# Patient Record
Sex: Female | Born: 1975 | Race: White | Hispanic: No | Marital: Married | Smoking: Former smoker
Health system: Southern US, Community
[De-identification: ages and names within clinical notes are randomized; demographics above are authoritative.]

## PROBLEM LIST (undated history)

## (undated) DIAGNOSIS — Z720 Tobacco use: Secondary | ICD-10-CM

## (undated) DIAGNOSIS — F4321 Adjustment disorder with depressed mood: Secondary | ICD-10-CM

## (undated) DIAGNOSIS — L509 Urticaria, unspecified: Secondary | ICD-10-CM

## (undated) DIAGNOSIS — G4700 Insomnia, unspecified: Secondary | ICD-10-CM

## (undated) DIAGNOSIS — E559 Vitamin D deficiency, unspecified: Secondary | ICD-10-CM

## (undated) HISTORY — PX: DENTAL SURGERY: SHX609

## (undated) HISTORY — DX: Vitamin D deficiency, unspecified: E55.9

## (undated) HISTORY — DX: Insomnia, unspecified: G47.00

## (undated) HISTORY — PX: KNEE ARTHROSCOPY: SHX127

## (undated) HISTORY — DX: Adjustment disorder with depressed mood: F43.21

## (undated) HISTORY — DX: Tobacco use: Z72.0

## (undated) HISTORY — DX: Urticaria, unspecified: L50.9

---

## 2000-10-02 ENCOUNTER — Encounter: Payer: Self-pay | Admitting: Obstetrics and Gynecology

## 2000-10-02 ENCOUNTER — Ambulatory Visit (HOSPITAL_COMMUNITY): Admission: RE | Admit: 2000-10-02 | Discharge: 2000-10-02 | Payer: Self-pay | Admitting: Obstetrics and Gynecology

## 2001-02-13 ENCOUNTER — Inpatient Hospital Stay (HOSPITAL_COMMUNITY): Admission: AD | Admit: 2001-02-13 | Discharge: 2001-02-15 | Payer: Self-pay | Admitting: Obstetrics and Gynecology

## 2001-02-16 ENCOUNTER — Encounter: Admission: RE | Admit: 2001-02-16 | Discharge: 2001-03-18 | Payer: Self-pay | Admitting: Obstetrics and Gynecology

## 2001-09-17 ENCOUNTER — Other Ambulatory Visit: Admission: RE | Admit: 2001-09-17 | Discharge: 2001-09-17 | Payer: Self-pay | Admitting: Obstetrics and Gynecology

## 2002-09-23 ENCOUNTER — Other Ambulatory Visit: Admission: RE | Admit: 2002-09-23 | Discharge: 2002-09-23 | Payer: Self-pay | Admitting: Obstetrics and Gynecology

## 2002-09-26 ENCOUNTER — Emergency Department (HOSPITAL_COMMUNITY): Admission: EM | Admit: 2002-09-26 | Discharge: 2002-09-26 | Payer: Self-pay | Admitting: Emergency Medicine

## 2002-09-26 ENCOUNTER — Encounter: Payer: Self-pay | Admitting: Emergency Medicine

## 2002-10-28 ENCOUNTER — Ambulatory Visit (HOSPITAL_COMMUNITY): Admission: RE | Admit: 2002-10-28 | Discharge: 2002-10-28 | Payer: Self-pay | Admitting: Obstetrics and Gynecology

## 2002-10-28 ENCOUNTER — Encounter: Payer: Self-pay | Admitting: Obstetrics and Gynecology

## 2004-04-12 ENCOUNTER — Other Ambulatory Visit: Admission: RE | Admit: 2004-04-12 | Discharge: 2004-04-12 | Payer: Self-pay | Admitting: Obstetrics and Gynecology

## 2005-06-13 ENCOUNTER — Other Ambulatory Visit: Admission: RE | Admit: 2005-06-13 | Discharge: 2005-06-13 | Payer: Self-pay | Admitting: Obstetrics and Gynecology

## 2012-06-21 ENCOUNTER — Other Ambulatory Visit: Payer: Self-pay | Admitting: Internal Medicine

## 2012-06-21 ENCOUNTER — Ambulatory Visit
Admission: RE | Admit: 2012-06-21 | Discharge: 2012-06-21 | Disposition: A | Discharge status: Home / Self Care | Payer: BC Managed Care – PPO | Source: Ambulatory Visit | Attending: Internal Medicine | Admitting: Internal Medicine

## 2012-06-21 DIAGNOSIS — M791 Myalgia, unspecified site: Secondary | ICD-10-CM

## 2015-01-23 ENCOUNTER — Other Ambulatory Visit: Payer: Self-pay | Admitting: Internal Medicine

## 2015-01-23 DIAGNOSIS — R109 Unspecified abdominal pain: Secondary | ICD-10-CM

## 2015-01-26 ENCOUNTER — Ambulatory Visit
Admission: RE | Admit: 2015-01-26 | Discharge: 2015-01-26 | Disposition: A | Discharge status: Home / Self Care | Payer: BLUE CROSS/BLUE SHIELD | Source: Ambulatory Visit | Attending: Internal Medicine | Admitting: Internal Medicine

## 2015-01-26 DIAGNOSIS — R109 Unspecified abdominal pain: Secondary | ICD-10-CM

## 2015-02-23 ENCOUNTER — Encounter: Payer: Self-pay | Admitting: Gastroenterology

## 2015-03-05 ENCOUNTER — Encounter: Payer: Self-pay | Admitting: Gastroenterology

## 2015-03-10 ENCOUNTER — Encounter: Payer: Self-pay | Admitting: Gastroenterology

## 2015-03-10 ENCOUNTER — Ambulatory Visit (INDEPENDENT_AMBULATORY_CARE_PROVIDER_SITE_OTHER): Payer: BLUE CROSS/BLUE SHIELD | Admitting: Gastroenterology

## 2015-03-10 VITALS — BP 98/60 | HR 78 | Ht 67.0 in | Wt 113.0 lb

## 2015-03-10 DIAGNOSIS — R1012 Left upper quadrant pain: Secondary | ICD-10-CM

## 2015-03-10 DIAGNOSIS — R5383 Other fatigue: Secondary | ICD-10-CM

## 2015-03-10 DIAGNOSIS — R634 Abnormal weight loss: Secondary | ICD-10-CM

## 2015-03-10 DIAGNOSIS — R195 Other fecal abnormalities: Secondary | ICD-10-CM | POA: Insufficient documentation

## 2015-03-10 DIAGNOSIS — R110 Nausea: Secondary | ICD-10-CM | POA: Diagnosis not present

## 2015-03-10 DIAGNOSIS — R531 Weakness: Secondary | ICD-10-CM

## 2015-03-10 NOTE — Progress Notes (Signed)
     03/10/2015 Haley Watson [id number] [birth year]   HISTORY OF PRESENT ILLNESS:  This is a 39 year old female who is a long-standing smoker who presents to our office today with her husband at the request of her PCP, Dr. Philip Aspen, for evaluation of some recent complaints.  She says that she's been experiencing pain in her LUQ that radiates to the left side of her back for the past few months.  Sometimes it is a burning type pain and at times she feels burning in the center of her chest as well.  She also complains of nausea, fatigue, weakness, and poor appetite.  She has lost 10 pounds recently as well.  Denies any bowel issues but says that her stools are a darker color at times.  She denies NSAID use.  Her PCP placed her on pantoprazole 40 mg BID and Zantac 300 mg at bedtime 3-4 weeks ago but she has not noticed much difference with those at this point.  Recent CBC, CMP, and TSH are WNL's.  She also complains of some hair loss.   Past Medical History  Diagnosis Date  . Insomnia   . Situational depression   . Tobacco abuse     cigarette smoker   Past Surgical History  Procedure Laterality Date  . Knee arthroscopy    . Dental surgery      reports that she has been smoking Cigarettes.  She has been smoking about 1.00 pack per day. She does not have any smokeless tobacco history on file. She reports that she does not drink alcohol or use illicit drugs. family history includes Cancer (age of onset: 50) in her brother; Coronary artery disease in her father; Other in her mother; Sleep apnea in her father. No Known Allergies    Outpatient Encounter Prescriptions as of 03/10/2015  Medication Sig  . pantoprazole (PROTONIX) 40 MG tablet Take 40 mg by mouth 2 (two) times daily.  . ranitidine (ZANTAC) 300 MG tablet Take 300 mg by mouth at bedtime.   No facility-administered encounter medications on file as of 03/10/2015.     REVIEW OF SYSTEMS  : All other systems reviewed and negative  except where noted in the History of Present Illness.   PHYSICAL EXAM: BP 98/60 mmHg  Pulse 78  Ht 5' 7"  (1.702 m)  Wt 113 lb (51.256 kg)  BMI 17.69 kg/m2  LMP 02/27/2015 (Exact Date) General: Well developed, but thin white female in no acute distress Head: Normocephalic and atraumatic Eyes:  Sclerae anicteric, conjunctiva pink. Ears: Normal auditory acuity Lungs: Clear throughout to auscultation Heart: Regular rate and rhythm Abdomen: Soft, non-distended.  Normal bowel sounds.  LUQ and mid-abdominal TTP without R/R/G. Musculoskeletal: Symmetrical with no gross deformities  Skin: No lesions on visible extremities Extremities: No edema  Neurological: Alert oriented x 4, grossly non-focal Psychological:  Alert and cooperative. Normal mood and affect  ASSESSMENT AND PLAN: -39 year old female with complaints of LUQ abdominal pain, 10 pound weight loss, fatigue, weakness, nausea, and some dark stools.  Will schedule CT scan of the abdomen and pelvis with contrast.  Will also schedule EGD with Dr. Henrene Pastor to rule out ulcer disease, Hpylori, etc.  She will continue the pantoprazole 40 mg BID and Zantac 300 mg at bedtime for now.  *The risks, benefits, and alternatives were discussed with the patient and she consents to proceed.    CC:  No ref. provider found

## 2015-03-10 NOTE — Patient Instructions (Signed)
You have been scheduled for an endoscopy. Please follow written instructions given to you at your visit today. If you use inhalers (even only as needed), please bring them with you on the day of your procedure.  You have been scheduled for a CT scan of the abdomen and pelvis at [facility] ([address]---this is in the same building as Press [facility]).   You are scheduled on 03/12/2015 at 1:00pm. You should arrive 15 minutes prior to your appointment time for registration. Please follow the written instructions below on the day of your exam:  WARNING: IF YOU ARE ALLERGIC TO IODINE/X-RAY DYE, PLEASE NOTIFY RADIOLOGY IMMEDIATELY AT [phone]! YOU WILL BE GIVEN A 13 HOUR PREMEDICATION PREP.  1) Do not eat or drink anything after 9:00am (4 hours prior to your test) 2) You have been given 2 bottles of oral contrast to drink. The solution may taste               better if refrigerated, but do NOT add ice or any other liquid to this solution. Shake             well before drinking.    Drink 1 bottle of contrast @ 11:00am (2 hours prior to your exam)  Drink 1 bottle of contrast @ 12:00pm (1 hour prior to your exam)  You may take any medications as prescribed with a small amount of water except for the following: Metformin, Glucophage, Glucovance, Avandamet, Riomet, Fortamet, Actoplus Met, Janumet, Glumetza or Metaglip. The above medications must be held the day of the exam AND 48 hours after the exam.  The purpose of you drinking the oral contrast is to aid in the visualization of your intestinal tract. The contrast solution may cause some diarrhea. Before your exam is started, you will be given a small amount of fluid to drink. Depending on your individual set of symptoms, you may also receive an intravenous injection of x-ray contrast/dye. Plan on being at [facility] for 30 minutes or long, depending on the type of exam you are having performed.  This test typically takes  30-45 minutes to complete.  If you have any questions regarding your exam or if you need to reschedule, you may call the CT department at [phone] between the hours of 8:00 am and 5:00 pm, Monday-Friday.  ________________________________________________________________________

## 2015-03-10 NOTE — Progress Notes (Signed)
Agree with initial assessment and plans as outlined

## 2015-03-12 ENCOUNTER — Ambulatory Visit (INDEPENDENT_AMBULATORY_CARE_PROVIDER_SITE_OTHER)
Admission: RE | Admit: 2015-03-12 | Discharge: 2015-03-12 | Disposition: A | Discharge status: Home / Self Care | Payer: BLUE CROSS/BLUE SHIELD | Source: Ambulatory Visit | Attending: Gastroenterology | Admitting: Gastroenterology

## 2015-03-12 DIAGNOSIS — R634 Abnormal weight loss: Secondary | ICD-10-CM

## 2015-03-12 DIAGNOSIS — R1012 Left upper quadrant pain: Secondary | ICD-10-CM | POA: Diagnosis not present

## 2015-03-12 DIAGNOSIS — R531 Weakness: Secondary | ICD-10-CM

## 2015-03-12 DIAGNOSIS — R110 Nausea: Secondary | ICD-10-CM

## 2015-03-13 ENCOUNTER — Telehealth: Payer: Self-pay | Admitting: Internal Medicine

## 2015-03-13 NOTE — Telephone Encounter (Signed)
Patient is calling for CT results.  Haley Watson please review and advise

## 2015-03-16 ENCOUNTER — Other Ambulatory Visit: Payer: Self-pay

## 2015-03-16 DIAGNOSIS — R9389 Abnormal findings on diagnostic imaging of other specified body structures: Secondary | ICD-10-CM

## 2015-03-19 ENCOUNTER — Telehealth: Payer: Self-pay | Admitting: Internal Medicine

## 2015-03-19 ENCOUNTER — Ambulatory Visit (HOSPITAL_COMMUNITY)
Admission: RE | Admit: 2015-03-19 | Discharge: 2015-03-19 | Disposition: A | Discharge status: Home / Self Care | Payer: BLUE CROSS/BLUE SHIELD | Source: Ambulatory Visit | Attending: Gastroenterology | Admitting: Gastroenterology

## 2015-03-19 DIAGNOSIS — K769 Liver disease, unspecified: Secondary | ICD-10-CM | POA: Diagnosis not present

## 2015-03-19 DIAGNOSIS — R9389 Abnormal findings on diagnostic imaging of other specified body structures: Secondary | ICD-10-CM

## 2015-03-19 NOTE — Telephone Encounter (Signed)
Discussed with DJ that there are no sooner appts available for EGD. Will put pt on cancellation list and call if have an opening prior to scheduled EGD. Discussed with DJ that if the pt states the pain is that bad she could be seen in the ER. DJ aware.

## 2015-05-06 ENCOUNTER — Encounter: Payer: Self-pay | Admitting: Internal Medicine

## 2015-05-06 ENCOUNTER — Ambulatory Visit (AMBULATORY_SURGERY_CENTER): Payer: BLUE CROSS/BLUE SHIELD | Admitting: Internal Medicine

## 2015-05-06 VITALS — BP 92/63 | HR 58 | Temp 98.3°F | Resp 44 | Ht 67.0 in | Wt 113.0 lb

## 2015-05-06 DIAGNOSIS — R634 Abnormal weight loss: Secondary | ICD-10-CM | POA: Diagnosis not present

## 2015-05-06 DIAGNOSIS — R110 Nausea: Secondary | ICD-10-CM

## 2015-05-06 DIAGNOSIS — R1012 Left upper quadrant pain: Secondary | ICD-10-CM

## 2015-05-06 MED ORDER — SODIUM CHLORIDE 0.9 % IV SOLN: 500.0000 mL | INTRAVENOUS | Status: DC

## 2015-05-06 NOTE — Progress Notes (Signed)
Transferred to recovery room. A/O x3, pleased with MAC.  VSS.  Report to Marshall, Therapist, sports.

## 2015-05-06 NOTE — Progress Notes (Signed)
No problems noted in the recovery room. maw 

## 2015-05-06 NOTE — Op Note (Signed)
[facility]  [address] [place], [address]   ENDOSCOPY PROCEDURE REPORT  PATIENT: Haley Watson, Haley Watson  MR#: [id number] BIRTHDATE: 05/02/1976 , 39  yrs. old GENDER: female ENDOSCOPIST: Eustace Quail, MD REFERRED BY:  Leanna Battles, M.D. PROCEDURE DATE:  05/06/2015 PROCEDURE:  EGD, diagnostic ASA CLASS:     Class I INDICATIONS:  abdominal pain in upper left quadrant and weight loss.. Symptoms not affected by high-dose acid suppressive therapy. Describes discomfort as constant and times burning. Positional component, worse with sitting. Extensive workup to date including laboratories and advanced imaging MEDICATIONS: Monitored anesthesia care and Propofol 125 mg IV TOPICAL ANESTHETIC: none  DESCRIPTION OF PROCEDURE: After the risks benefits and alternatives of the procedure were thoroughly explained, informed consent was obtained.  The LB [id number] endoscope was introduced through the mouth and advanced to the second portion of the duodenum , Without limitations.  The instrument was slowly withdrawn as the mucosa was fully examined.    EXAM: The esophagus and gastroesophageal junction were completely normal in appearance.  The stomach was entered and closely examined.The antrum, angularis, and lesser curvature were well visualized, including a retroflexed view of the cardia and fundus. The stomach wall was normally distensable.  The scope passed easily through the pylorus into the duodenum.  Retroflexed views revealed no abnormalities.     The scope was then withdrawn from the patient and the procedure completed.  COMPLICATIONS: There were no immediate complications.  ENDOSCOPIC IMPRESSION: 1. Normal EGD 2. No GI cause for discomfort found or suspected. Likely musculoskeletal  RECOMMENDATIONS: 1. Return to the care of Dr. Philip Aspen 2. GI follow-up as needed.  REPEAT EXAM:  eSigned:  Eustace Quail, MD 05/06/2015 4:24 PM    CC:The  Patient and Leanna Battles, MD

## 2015-05-06 NOTE — Patient Instructions (Signed)
YOU HAD AN ENDOSCOPIC PROCEDURE TODAY AT [facility]:   Refer to the procedure report that was given to you for any specific questions about what was found during the examination.  If the procedure report does not answer your questions, please call your gastroenterologist to clarify.  If you requested that your care partner not be given the details of your procedure findings, then the procedure report has been included in a sealed envelope for you to review at your convenience later.  YOU SHOULD EXPECT: Some feelings of bloating in the abdomen. Passage of more gas than usual.  Walking can help get rid of the air that was put into your GI tract during the procedure and reduce the bloating. If you had a lower endoscopy (such as a colonoscopy or flexible sigmoidoscopy) you may notice spotting of blood in your stool or on the toilet paper. If you underwent a bowel prep for your procedure, you may not have a normal bowel movement for a few days.  Please Note:  You might notice some irritation and congestion in your nose or some drainage.  This is from the oxygen used during your procedure.  There is no need for concern and it should clear up in a day or so.  SYMPTOMS TO REPORT IMMEDIATELY:    Following upper endoscopy (EGD)  Vomiting of blood or coffee ground material  New chest pain or pain under the shoulder blades  Painful or persistently difficult swallowing  New shortness of breath  Fever of 100F or higher  Black, tarry-looking stools  For urgent or emergent issues, a gastroenterologist can be reached at any hour by calling [phone].   DIET: Your first meal following the procedure should be a small meal and then it is ok to progress to your normal diet. Heavy or fried foods are harder to digest and may make you feel nauseous or bloated.  Likewise, meals heavy in dairy and vegetables can increase bloating.  Drink plenty of fluids but you should avoid alcoholic beverages  for 24 hours.  ACTIVITY:  You should plan to take it easy for the rest of today and you should NOT DRIVE or use heavy machinery until tomorrow (because of the sedation medicines used during the test).    FOLLOW UP: Our staff will call the number listed on your records the next business day following your procedure to check on you and address any questions or concerns that you may have regarding the information given to you following your procedure. If we do not reach you, we will leave a message.  However, if you are feeling well and you are not experiencing any problems, there is no need to return our call.  We will assume that you have returned to your regular daily activities without incident.  If any biopsies were taken you will be contacted by phone or by letter within the next 1-3 weeks.  Please call us at [phone] if you have not heard about the biopsies in 3 weeks.    SIGNATURES/CONFIDENTIALITY: You and/or your care partner have signed paperwork which will be entered into your electronic medical record.  These signatures attest to the fact that that the information above on your After Visit Summary has been reviewed and is understood.  Full responsibility of the confidentiality of this discharge information lies with you and/or your care-partner.    You may resume your current medications today. Return to the care of Dr. Philip Aspen. Please call if  any questions or concerns.

## 2015-05-07 ENCOUNTER — Telehealth: Payer: Self-pay | Admitting: Emergency Medicine

## 2015-05-07 NOTE — Telephone Encounter (Signed)
  Follow up Call-  Call back number 05/06/2015  Post procedure Call Back phone  # [phone]  Permission to leave phone message Yes     Patient questions:  Do you have a fever, pain , or abdominal swelling? No. Pain Score  0 *  Have you tolerated food without any problems? Yes.    Have you been able to return to your normal activities? Yes.    Do you have any questions about your discharge instructions: Diet   No. Medications  No. Follow up visit  No.  Do you have questions or concerns about your Care? No.  Actions: * If pain score is 4 or above: No action needed, pain <4.

## 2016-01-13 IMAGING — US US ABDOMEN COMPLETE
1 series · 14 of 25 positions shown · non-contrast
Comparison: CT 03/12/2015.

CLINICAL DATA: Liver lesion on CT.

EXAM:
ULTRASOUND ABDOMEN COMPLETE

[Series 1: us abdomen complete · 0.14mm/px · 14 of 100 slices shown]
[im 1/100]
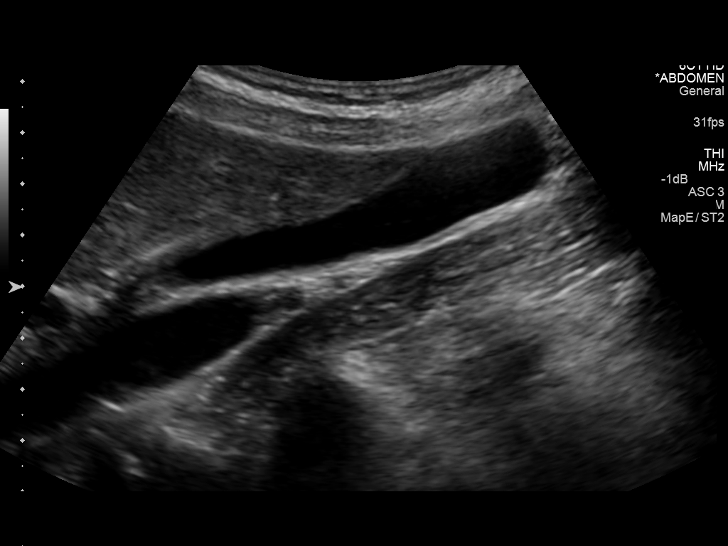
[im 9/100]
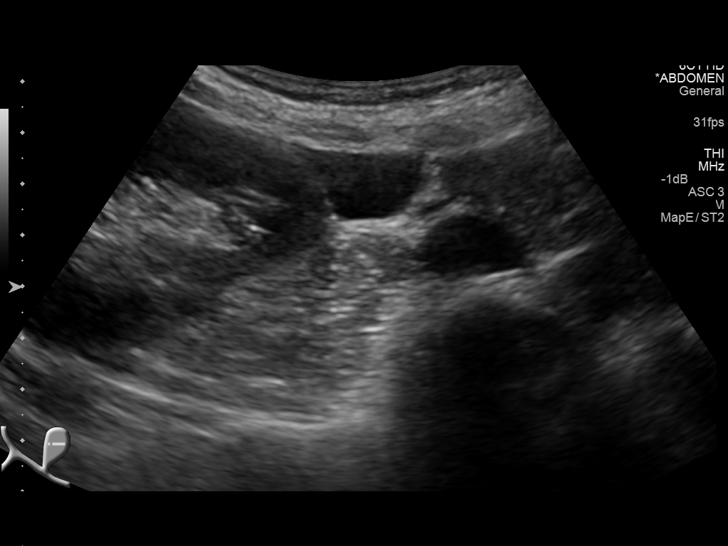
[im 17/100]
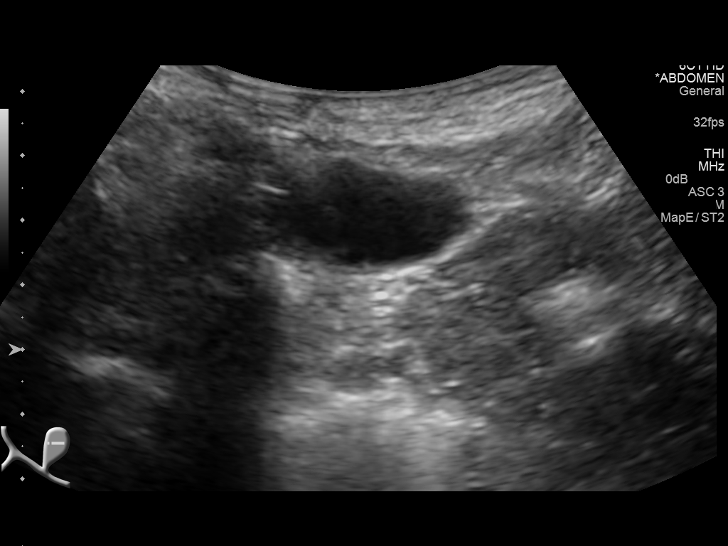
[im 25/100]
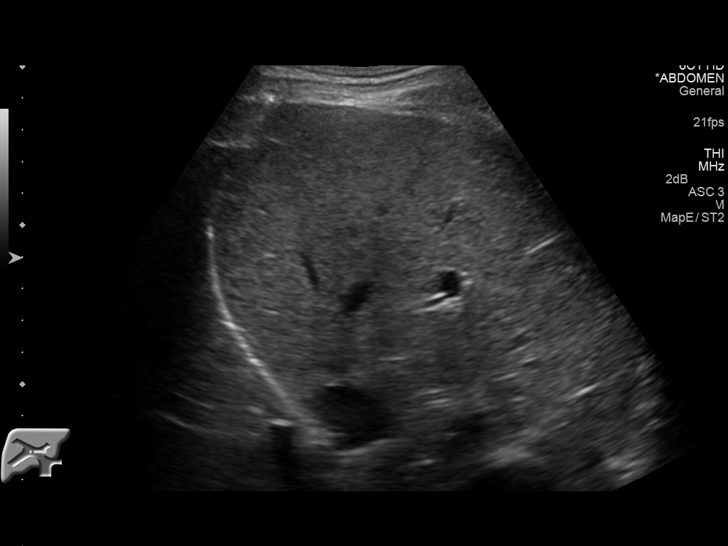
[im 34/100]
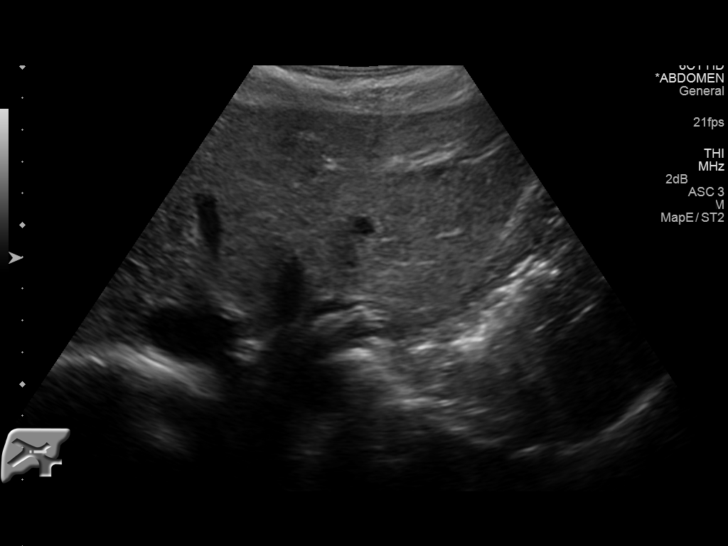
[im 38/100]
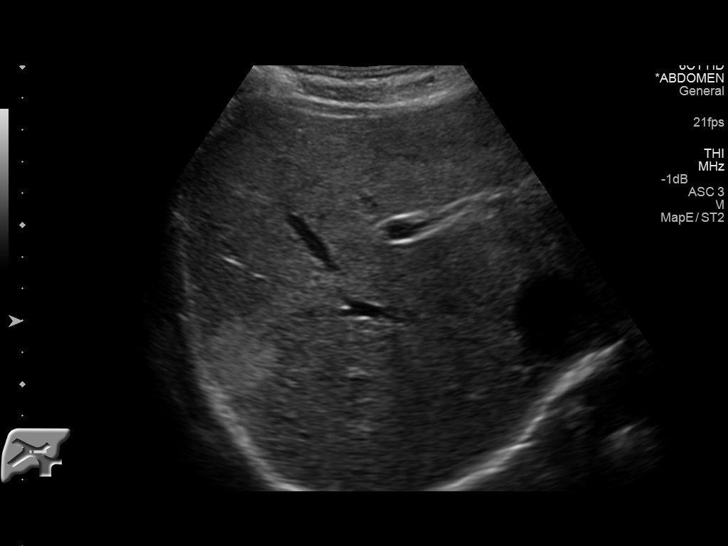
[im 46/100]
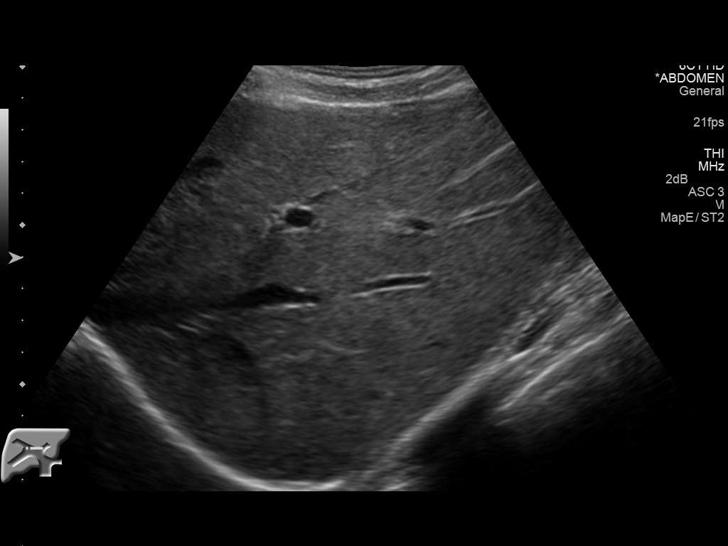
[im 54/100]
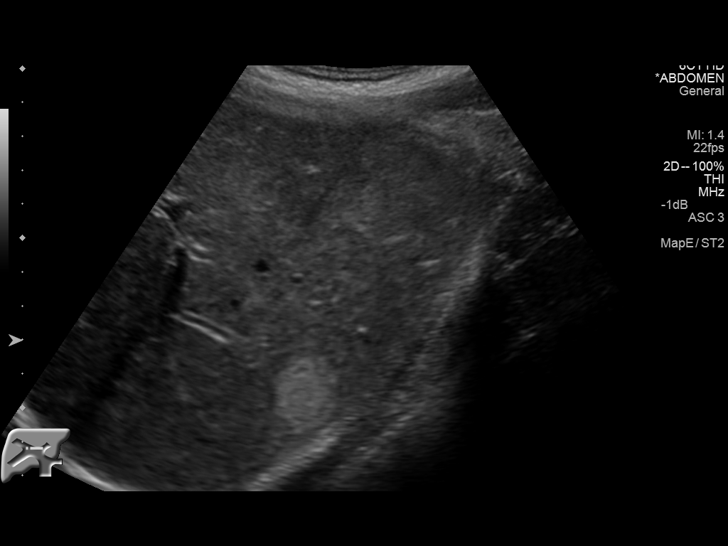
[im 62/100]
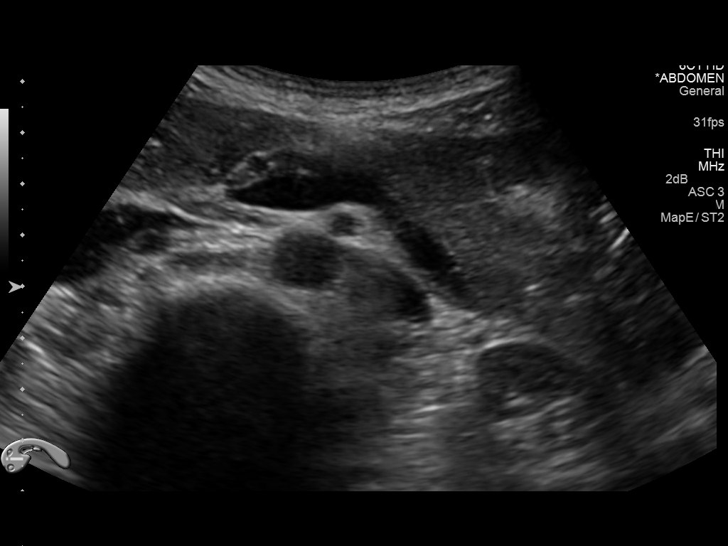
[im 67/100]
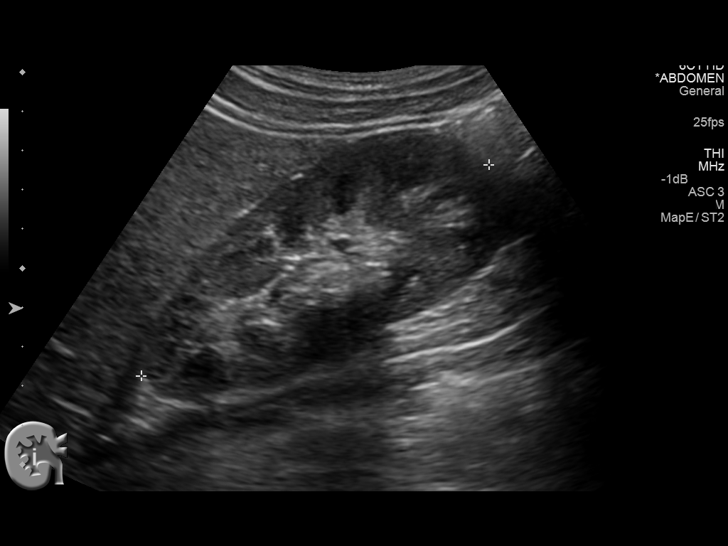
[im 75/100]
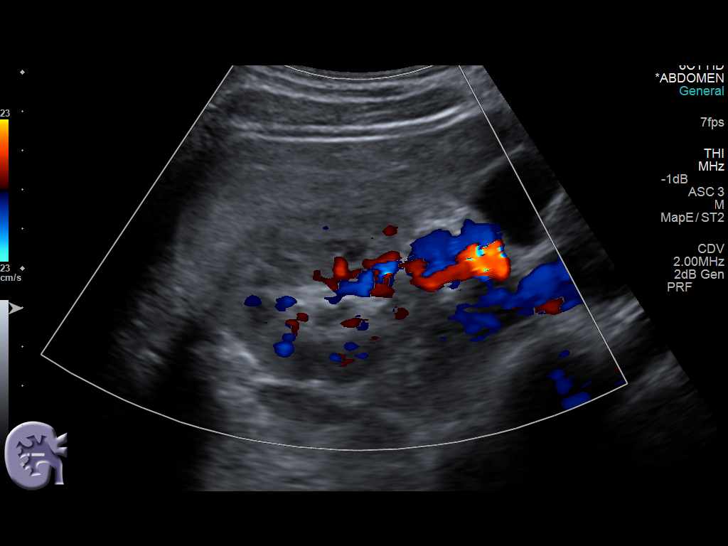
[im 83/100]
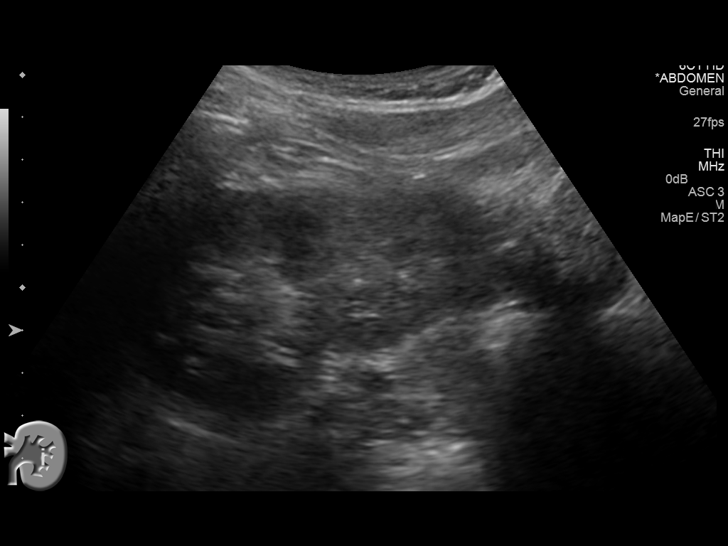
[im 91/100]
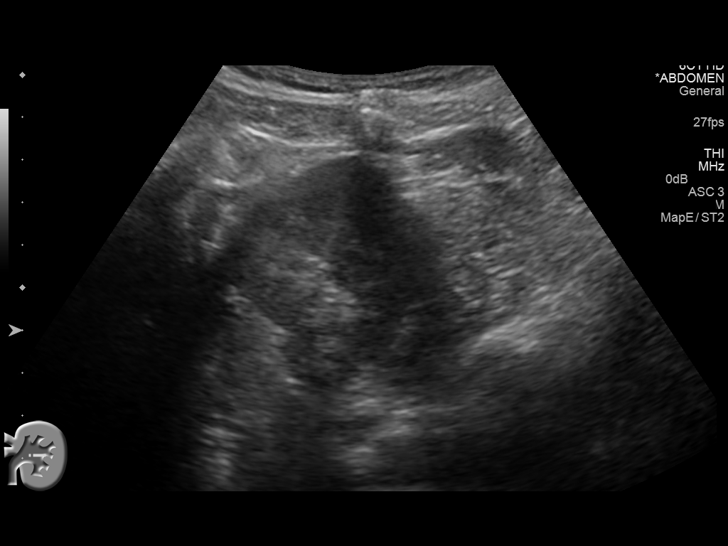
[im 100/100]
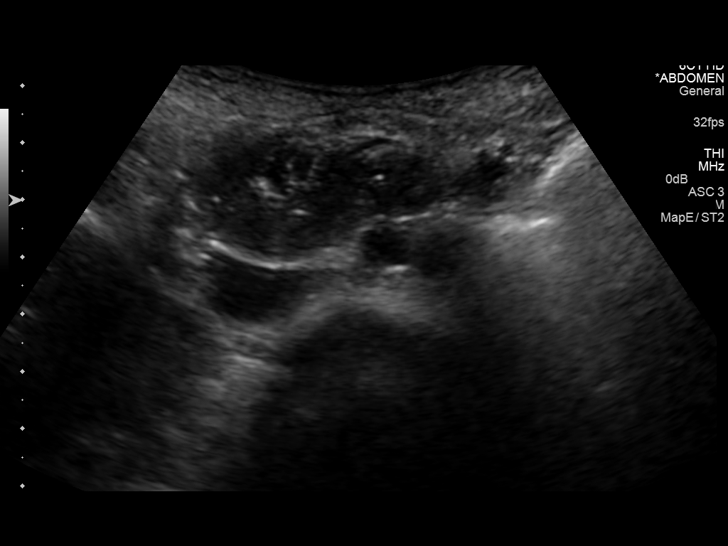

[14 of 25 positions shown; findings below may reference images not displayed]

FINDINGS: Gallbladder: No gallstones or wall thickening visualized. No
sonographic Murphy sign noted.

Common bile duct: Diameter: 1.2 mm

Liver: 2.3 x 2.1 x 2.4 cm hyperechoic lesion in the right hepatic
lobe in the region of previously identified CT abnormality. And
although a significant lesion cannot be excluded given the CT and
ultrasound characteristics this is is most likely benign hemangioma.

IVC: No abnormality visualized.

Pancreas: Visualized portion unremarkable.

Spleen: Size and appearance within normal limits.

Right Kidney: Length: 10.4 cm. Echogenicity within normal limits. No
mass or hydronephrosis visualized.

Left Kidney: Length: 10.6 cm. Echogenicity within normal limits. No
mass or hydronephrosis visualized.

Abdominal aorta: No aneurysm visualized.

Other findings: None.
IMPRESSION: Findings consistent with small hepatic hemangioma in the region of
previously identified CT abnormality. No other focal abnormalities
identified.

## 2016-03-16 IMAGING — US US RENAL
1 series · 14 of 25 positions shown · non-contrast
Comparison: None.

CLINICAL DATA: Left-sided flank pain

EXAM:
RENAL / URINARY TRACT ULTRASOUND COMPLETE

[Series 1: us renal · 0.30mm/px · 39 acquisitions, 14 frames shown]
[im 1/39]
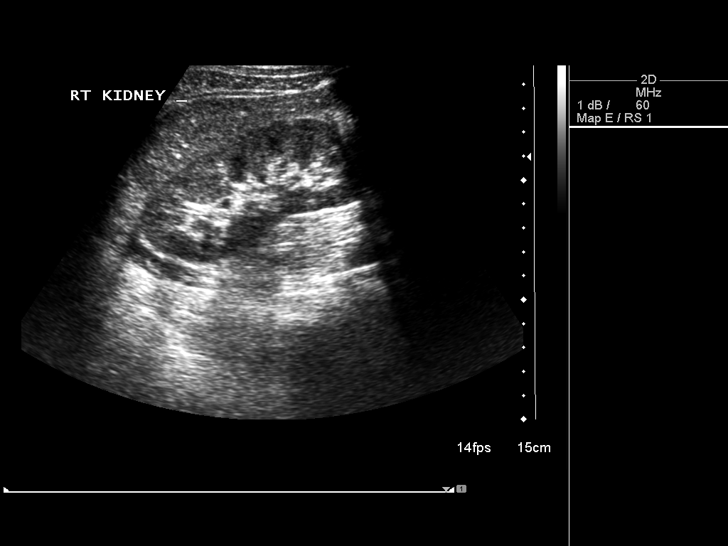
[im 4/39]
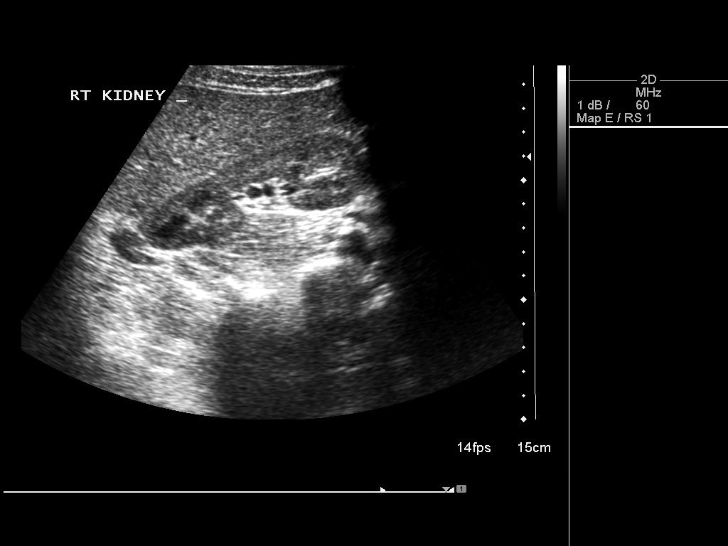
[im 7/39]
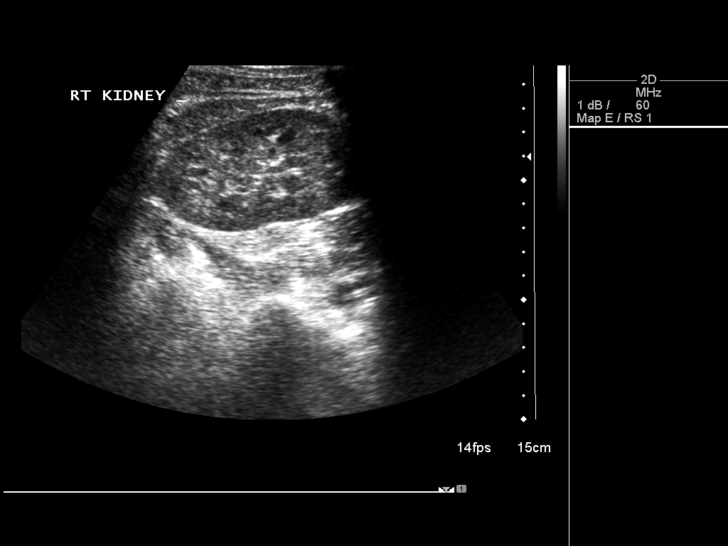
[im 10/39]
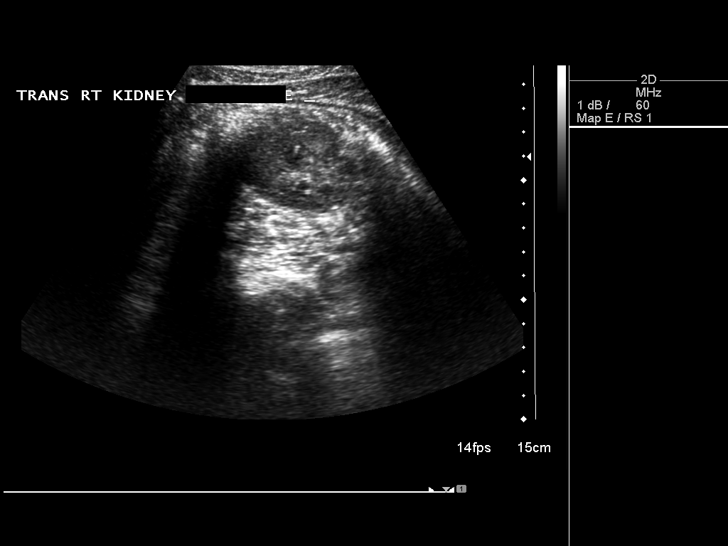
[im 13/39]
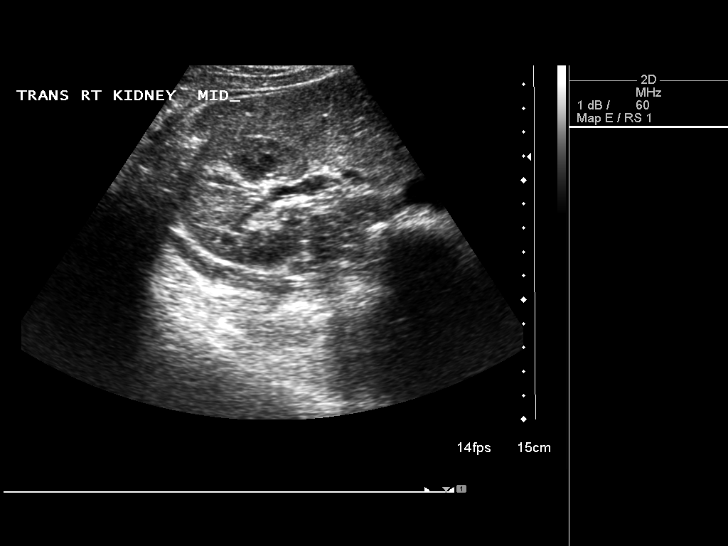
[im 15/39]
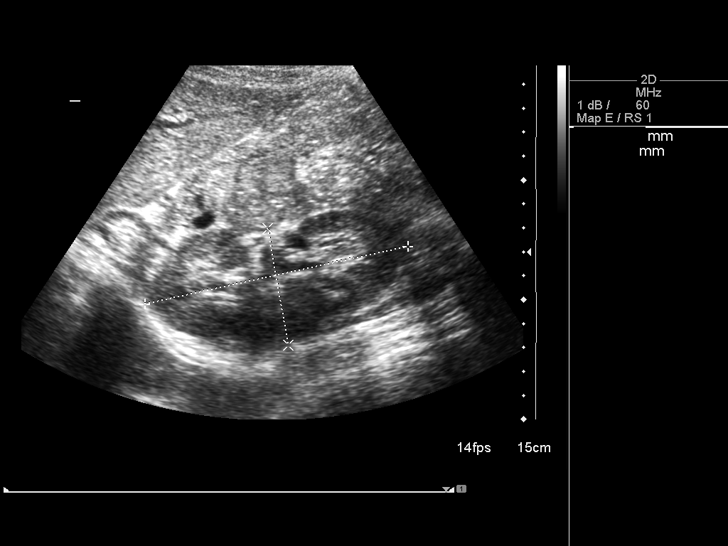
[im 18/39]
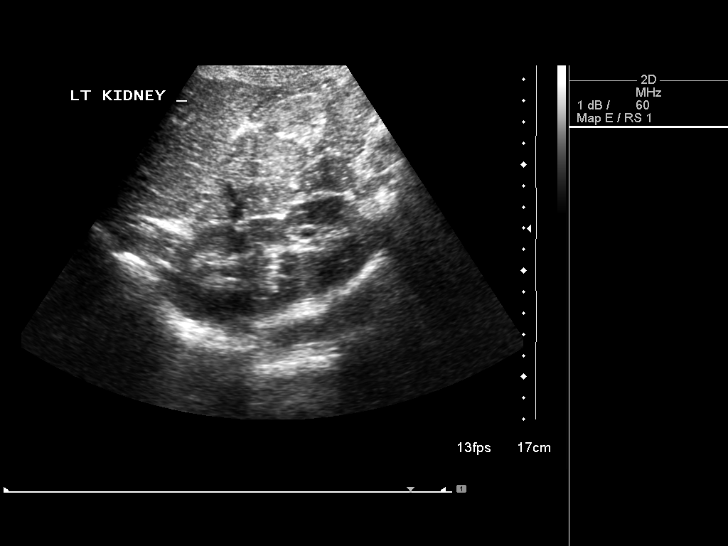
[im 21/39]
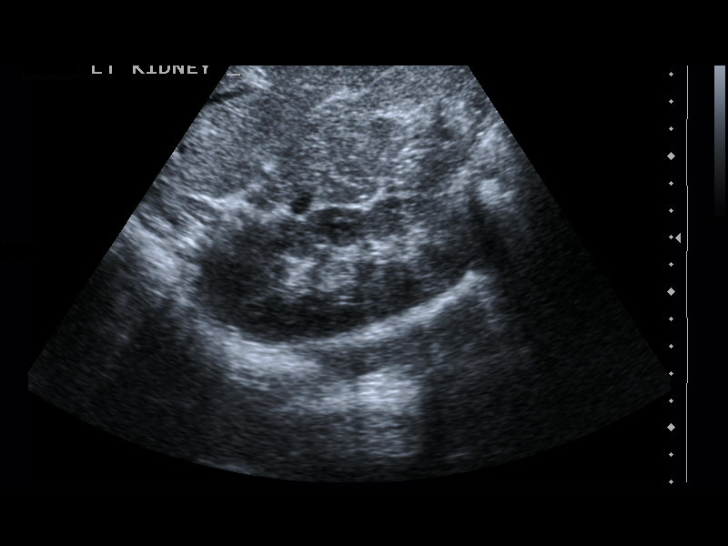
[im 24/39]
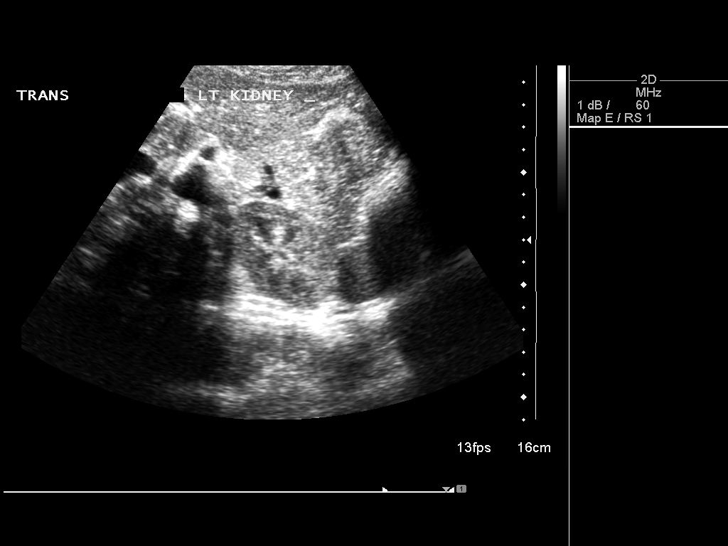
[im 26/39]
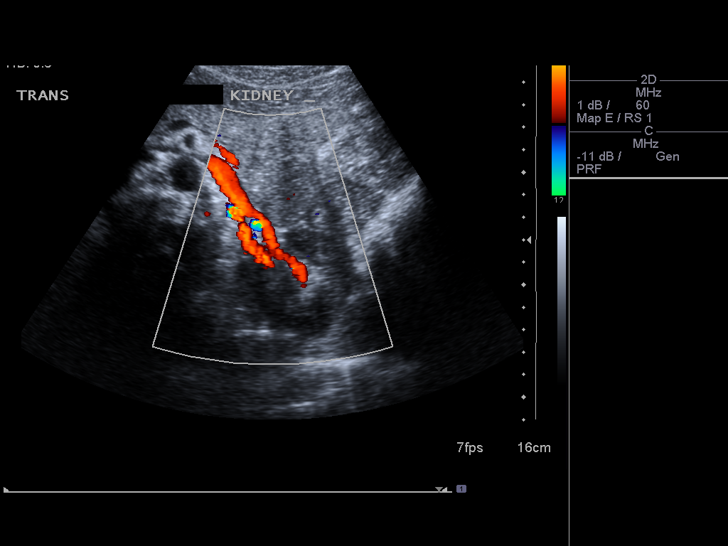
[im 29/39]
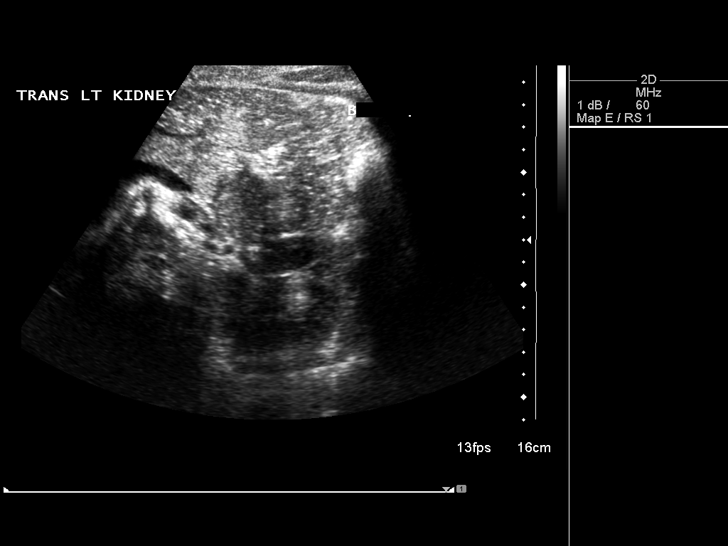
[im 32/39]
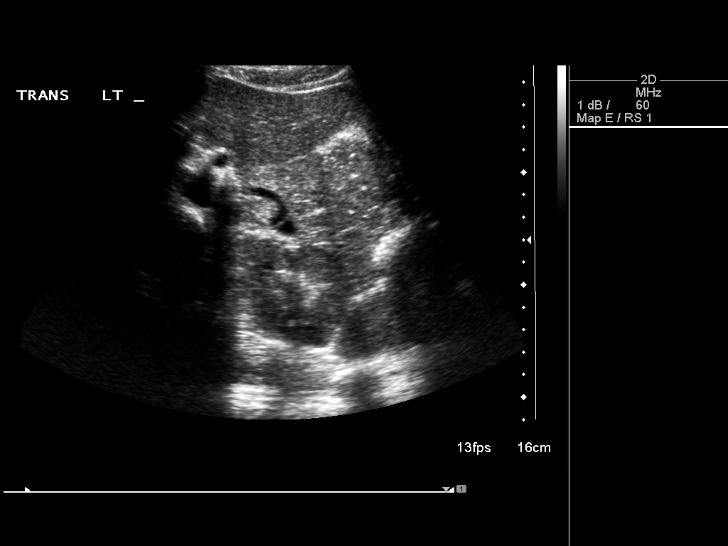
[im 35/39]
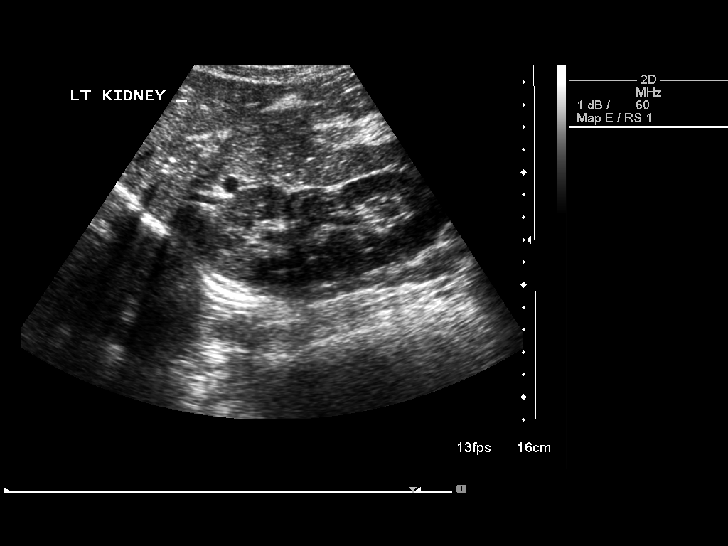
[im 39/39]
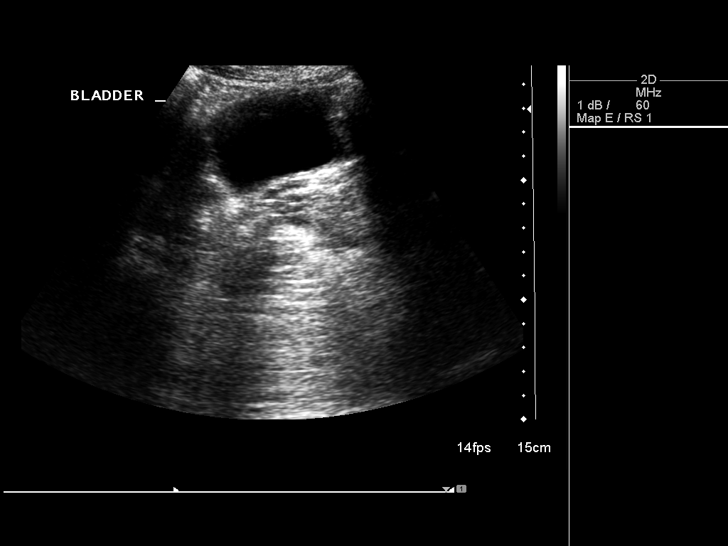

[14 of 25 positions shown; findings below may reference images not displayed]

FINDINGS: Right Kidney:

Length: 10.3 cm. Echogenicity and renal cortical thickness are
within normal limits. No mass, perinephric fluid, or hydronephrosis
visualized. No sonographically demonstrable calculus or
ureterectasis.

Left Kidney:

Length: 11.2 cm.. Echogenicity and renal cortical thickness are
within normal limits. No mass, perinephric fluid, or hydronephrosis
visualized. No sonographically demonstrable calculus or
ureterectasis.

Bladder:

Appears normal for degree of bladder distention.
IMPRESSION: Study within normal limits.

## 2016-03-17 ENCOUNTER — Other Ambulatory Visit: Payer: Self-pay | Admitting: Internal Medicine

## 2016-03-17 DIAGNOSIS — Z1231 Encounter for screening mammogram for malignant neoplasm of breast: Secondary | ICD-10-CM

## 2016-04-19 ENCOUNTER — Other Ambulatory Visit: Payer: Self-pay | Admitting: Internal Medicine

## 2016-04-19 DIAGNOSIS — R590 Localized enlarged lymph nodes: Secondary | ICD-10-CM

## 2016-04-22 ENCOUNTER — Ambulatory Visit
Admission: RE | Admit: 2016-04-22 | Discharge: 2016-04-22 | Disposition: A | Discharge status: Home / Self Care | Payer: BC Managed Care – PPO | Source: Ambulatory Visit | Attending: Internal Medicine | Admitting: Internal Medicine

## 2016-04-22 DIAGNOSIS — R590 Localized enlarged lymph nodes: Secondary | ICD-10-CM

## 2016-05-02 ENCOUNTER — Other Ambulatory Visit: Payer: Self-pay | Admitting: Surgery

## 2016-05-02 ENCOUNTER — Ambulatory Visit
Admission: RE | Admit: 2016-05-02 | Discharge: 2016-05-02 | Disposition: A | Discharge status: Home / Self Care | Payer: BC Managed Care – PPO | Source: Ambulatory Visit | Attending: Surgery | Admitting: Surgery

## 2016-05-02 DIAGNOSIS — R51 Headache: Principal | ICD-10-CM

## 2016-05-02 DIAGNOSIS — R519 Headache, unspecified: Secondary | ICD-10-CM

## 2016-05-03 NOTE — Progress Notes (Signed)
Please call the patient and let them know that the head CT looked OK.  I spoke with Dr. Philip Aspen and he will be calling her for an appointment to continue the workup.  We will wait to hear back from him.

## 2016-05-04 ENCOUNTER — Ambulatory Visit
Admission: RE | Admit: 2016-05-04 | Discharge: 2016-05-04 | Disposition: A | Discharge status: Home / Self Care | Payer: BC Managed Care – PPO | Source: Ambulatory Visit | Attending: Surgery | Admitting: Surgery

## 2016-05-04 DIAGNOSIS — R51 Headache: Principal | ICD-10-CM

## 2016-05-04 DIAGNOSIS — R519 Headache, unspecified: Secondary | ICD-10-CM

## 2016-06-01 ENCOUNTER — Ambulatory Visit (INDEPENDENT_AMBULATORY_CARE_PROVIDER_SITE_OTHER): Payer: BC Managed Care – PPO | Admitting: Neurology

## 2016-06-01 ENCOUNTER — Encounter: Payer: Self-pay | Admitting: Neurology

## 2016-06-01 VITALS — BP 98/62 | HR 60 | Resp 14 | Ht 67.0 in | Wt 116.0 lb

## 2016-06-01 DIAGNOSIS — R0683 Snoring: Secondary | ICD-10-CM

## 2016-06-01 DIAGNOSIS — R519 Headache, unspecified: Secondary | ICD-10-CM

## 2016-06-01 DIAGNOSIS — G2581 Restless legs syndrome: Secondary | ICD-10-CM | POA: Diagnosis not present

## 2016-06-01 DIAGNOSIS — R51 Headache: Secondary | ICD-10-CM | POA: Diagnosis not present

## 2016-06-01 DIAGNOSIS — R400 Somnolence: Secondary | ICD-10-CM | POA: Diagnosis not present

## 2016-06-01 DIAGNOSIS — R0681 Apnea, not elsewhere classified: Secondary | ICD-10-CM

## 2016-06-01 DIAGNOSIS — R251 Tremor, unspecified: Secondary | ICD-10-CM | POA: Diagnosis not present

## 2016-06-01 NOTE — Progress Notes (Signed)
Subjective:    Patient ID: Haley Watson is a 40 y.o. female.  HPI     Star Age, MD, PhD [facility] [address], [address] [address] [place], [place] [address]  Dear Dr. Philip Watson,   I saw your patient, Haley Watson, upon your kind request in my neurologic clinic today for initial consultation for sleep disturbance, in particular difficulty with sleep maintenance issues, snoring, and even witnessed apneic pauses while asleep per husband's report. The patient is unaccompanied today. As you know, Haley Watson is a 40 year old right-handed woman with an underlying medical history of smoking, reflux disease, weight loss, and below normal BMI, who reports sleep disruption, disturbing snoring, apneas, per family report, and a FHx of OSA in her father.  I reviewed your office note from 05/23/2016, which you kindly included. She had recent blood work through your office including CBC with platelets, TSH, CMP, vitamin D, FSH, LH and prolactin level, we will request test results from your office. 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 She has 1 son, age 102 (in college) and 1 daughter, age 42.  She has had recent stressors, including health scares, including lump in breast, lymphadenopathy, recent passing of her mother (39 yo, brain cancer).  She had RLS symptoms and tends to rub her legs against each other in bed. She goes to bed between 11:30 and midnight, wakeup time is around 7 AM. Her Epworth sleepiness score is 7 out of 24 today, her fatigue score is 60 out of 63. She has multiple middle of the night awakenings but usually brief. She has occasional nocturia, occasional morning headaches. She does drink quite a bit of caffeine, in the form of coffee, about 5 cups per day, no sodas. She drinks alcohol very infrequently, about twice a year. She smokes about 20 cigarettes per day.  Her Past Medical History Is Significant For: Past Medical History:  Diagnosis  Date  . Insomnia   . Situational depression   . Tobacco abuse    cigarette smoker  . Vitamin D deficiency     Her Past Surgical History Is Significant For: Past Surgical History:  Procedure Laterality Date  . DENTAL SURGERY    . KNEE ARTHROSCOPY      Her Family History Is Significant For: Family History  Problem Relation Age of Onset  . Other Mother     Brain tumor  . Cancer Mother   . Coronary artery disease Father   . Sleep apnea Father   . Cancer Brother 44    ?    Her Social History Is Significant For: Social History   Social History  . Marital status: Married    Spouse name: Dexter  . Number of children: 2  . Years of education: college   Occupational History  . 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 Smoking status: Current Every Day Smoker    Packs/day: 1.00    Types: Cigarettes  . Smokeless tobacco: Never Used  . Alcohol use 0.0 oz/week     Comment: 2x a year  . Drug use: No  . Sexual activity: Not Asked   Other Topics Concern  . None   Social History Narrative   Drinks 5 caffeine drinks a day     Her Allergies Are:  No Known Allergies:   Her Current Medications Are:  Outpatient Encounter Prescriptions as of 06/01/2016  Medication Sig  .  escitalopram (LEXAPRO) 5 MG tablet   . vitamin B-12 (CYANOCOBALAMIN) 1000 MCG tablet Take 1,000 mcg by mouth daily.  . Vitamin D, Ergocalciferol, (DRISDOL) 50000 units CAPS capsule Take 50,000 Units by mouth every 7 (seven) days.  . [DISCONTINUED] pantoprazole (PROTONIX) 40 MG tablet Take 40 mg by mouth 2 (two) times daily.  . [DISCONTINUED] ranitidine (ZANTAC) 300 MG tablet Take 300 mg by mouth at bedtime.   No facility-administered encounter medications on file as of 06/01/2016.   :  Review of Systems:  Out of a complete 14 point review of systems, all are reviewed and negative with the exception of these symptoms as listed below:  Review of Systems   Neurological:       Snoring, witnessed apneic events, morning headaches, daytime fatigue, taking naps.    Epworth Sleepiness Scale 0= would never doze 1= slight chance of dozing 2= moderate chance of dozing 3= high chance of dozing  Sitting and reading:2 Watching TV:1 Sitting inactive in a public place (ex. Theater or meeting):0 As a passenger in a car for an hour without a break:2 Lying down to rest in the afternoon:2 Sitting and talking to someone:0 Sitting quietly after lunch (no alcohol):0 In a car, while stopped in traffic:0 Total:7  Objective:  Neurologic Exam  Physical Exam Physical Examination:   Vitals:   06/01/16 1129  BP: 98/62  Pulse: 60  Resp: 14   General Examination: The patient is a very pleasant 40 y.o. female in no acute distress. She appears well-developed and well-nourished and very well groomed.   HEENT: Normocephalic, atraumatic, pupils are equal, round and reactive to light and accommodation. Funduscopic exam is normal with sharp disc margins noted. Extraocular tracking is good without limitation to gaze excursion or nystagmus noted. Normal smooth pursuit is noted. Hearing is grossly intact. Tympanic membranes are clear bilaterally. Face is symmetric with normal facial animation and normal facial sensation. Speech is clear with no dysarthria noted. There is no hypophonia. There is no lip, neck/head, jaw or voice tremor. Neck is supple with full range of passive and active motion. There are no carotid bruits on auscultation. Oropharynx exam reveals: mild mouth dryness, good dental hygiene and mild airway crowding, due to larger appearing tongue, smaller airway entry. Mallampati is class II. Tongue protrudes centrally and palate elevates symmetrically. Tonsils are 1+ in size. Neck size is 12 3/8 inches. She has a Mild overbite. Nasal inspection reveals no significant nasal mucosal bogginess or redness and no septal deviation.   Chest: Clear to auscultation  without wheezing, rhonchi or crackles noted.  Heart: S1+S2+0, regular and normal without murmurs, rubs or gallops noted.   Abdomen: Soft, non-tender and non-distended with normal bowel sounds appreciated on auscultation.  Extremities: There is no pitting edema in the distal lower extremities bilaterally. Pedal pulses are intact.  Skin: Warm and dry without trophic changes noted. There are no varicose veins.  Musculoskeletal: exam reveals no obvious joint deformities, tenderness or joint swelling or erythema.   Neurologically:  Mental status: The patient is awake, alert and oriented in all 4 spheres. Her immediate and remote memory, attention, language skills and fund of knowledge are appropriate. There is no evidence of aphasia, agnosia, apraxia or anomia. Speech is clear with normal prosody and enunciation. Thought process is linear. Mood is normal and affect is normal.  Cranial nerves II - XII are as described above under HEENT exam. In addition: shoulder shrug is normal with equal shoulder height noted. Motor exam: Normal bulk,  strength and tone is noted. There is no drift, resting tremor or rebound. She has a very fine bilateral action tremor and postural tremor. No intention tremor. Romberg is negative. Reflexes are 2+ throughout. Fine motor skills and coordination: intact with normal finger taps, normal hand movements, normal rapid alternating patting, normal foot taps and normal foot agility.  Cerebellar testing: No dysmetria or intention tremor on finger to nose testing. Heel to shin is unremarkable bilaterally. There is no truncal or gait ataxia.  Sensory exam: intact to light touch, pinprick, vibration, temperature sense in the upper and lower extremities.  Gait, station and balance: She stands easily. No veering to one side is noted. No leaning to one side is noted. Posture is age-appropriate and stance is narrow based. Gait shows normal stride length and normal pace. No problems turning  are noted. Tandem walk is unremarkable.   Assessment and Plan:   In summary, Lachina I Benecke is a very pleasant 40 y.o.-year old female with an underlying medical history of smoking, reflux disease, weight loss, and below normal BMI, whose history and physical exam are concerning for obstructive sleep apnea (OSA). In addition, she endorses restless leg symptoms. She has a very slight upper extremity postural and action tremor, she endorses a tremor usually secondary to hypoglycemia, denies a family history of essential tremor. She is advised to not skip any meals and have snacks with her. She admits that  I had a long chat with the patient about my findings and the diagnosis of OSA, its prognosis and treatment options. We talked about medical treatments, surgical interventions and non-pharmacological approaches. I explained in particular the risks and ramifications of untreated moderate to severe OSA, especially with respect to developing cardiovascular disease down the Road, including congestive heart failure, difficult to treat hypertension, cardiac arrhythmias, or stroke. Even type 2 diabetes has, in part, been linked to untreated OSA. Symptoms of untreated OSA include daytime sleepiness, memory problems, mood irritability and mood disorder such as depression and anxiety, lack of energy, as well as recurrent headaches, especially morning headaches. We talked about smoking cessation and trying to maintain a healthy lifestyle in general, as well as the importance of maintaining healthy weight. In addition, she is advised to try to reduce her caffeine intake and stay well-hydrated with water. I encouraged the patient to eat healthy, exercise daily and keep well hydrated, to keep a scheduled bedtime and wake time routine, to not skip any meals and eat healthy snacks in between meals. I advised the patient not to drive when feeling sleepy. I recommended the following at this time: sleep study with potential  positive airway pressure titration. (We will score hypopneas at 3% and split the sleep study into diagnostic and treatment portion, if the estimated. 2 hour AHI is >15/h).   I explained the sleep test procedure to the patient and also outlined possible surgical and non-surgical treatment options of OSA, including the use of a custom-made dental device (which would require a referral to a specialist dentist or oral surgeon), upper airway surgical options, such as pillar implants, radiofrequency surgery, tongue base surgery, and UPPP (which would involve a referral to an ENT surgeon). Rarely, jaw surgery such as mandibular advancement may be considered.  I also explained the CPAP treatment option to the patient, who indicated that she would be willing to try CPAP if the need arises. I explained the importance of being compliant with PAP treatment, not only for insurance purposes but primarily to improve Her  symptoms, and for the patient's long term health benefit, including to reduce Her cardiovascular risks. I answered all her questions today and the patient was in agreement. I would like to see her back after the sleep study is completed and encouraged her to call with any interim questions, concerns, problems or updates.   Thank you very much for allowing me to participate in the care of this nice patient. If I can be of any further assistance to you please do not hesitate to call me at [phone].  Sincerely,   Star Age, MD, PhD

## 2016-06-01 NOTE — Patient Instructions (Signed)

## 2016-06-16 ENCOUNTER — Telehealth: Payer: Self-pay | Admitting: Hematology and Oncology

## 2016-06-16 ENCOUNTER — Encounter: Payer: Self-pay | Admitting: Hematology and Oncology

## 2016-06-16 NOTE — Telephone Encounter (Signed)
Pt confirmed appt, verified demo and insurance, mailed pt letter, faxed referring provider appt date/time.

## 2016-06-27 ENCOUNTER — Encounter: Payer: Self-pay | Admitting: Hematology and Oncology

## 2016-06-27 ENCOUNTER — Ambulatory Visit (HOSPITAL_BASED_OUTPATIENT_CLINIC_OR_DEPARTMENT_OTHER): Payer: BC Managed Care – PPO

## 2016-06-27 ENCOUNTER — Telehealth: Payer: Self-pay | Admitting: Hematology and Oncology

## 2016-06-27 ENCOUNTER — Ambulatory Visit (HOSPITAL_BASED_OUTPATIENT_CLINIC_OR_DEPARTMENT_OTHER): Payer: BC Managed Care – PPO | Admitting: Hematology and Oncology

## 2016-06-27 VITALS — BP 104/73 | HR 68 | Temp 98.1°F | Resp 18 | Wt 114.0 lb

## 2016-06-27 DIAGNOSIS — Z720 Tobacco use: Secondary | ICD-10-CM | POA: Insufficient documentation

## 2016-06-27 DIAGNOSIS — R591 Generalized enlarged lymph nodes: Secondary | ICD-10-CM

## 2016-06-27 DIAGNOSIS — R5383 Other fatigue: Secondary | ICD-10-CM

## 2016-06-27 DIAGNOSIS — R634 Abnormal weight loss: Secondary | ICD-10-CM

## 2016-06-27 LAB — COMPREHENSIVE METABOLIC PANEL
ALBUMIN: 4.5 g/dL (ref 3.5–5.0)
ALK PHOS: 46 U/L (ref 40–150)
ALT: 15 U/L (ref 0–55)
ANION GAP: 11 meq/L (ref 3–11)
AST: 22 U/L (ref 5–34)
BUN: 7 mg/dL (ref 7.0–26.0)
CALCIUM: 10 mg/dL (ref 8.4–10.4)
CHLORIDE: 105 meq/L (ref 98–109)
CO2: 25 mEq/L (ref 22–29)
Creatinine: 0.8 mg/dL (ref 0.6–1.1)
Glucose: 91 mg/dl (ref 70–140)
POTASSIUM: 4.4 meq/L (ref 3.5–5.1)
Sodium: 141 mEq/L (ref 136–145)
Total Bilirubin: 0.55 mg/dL (ref 0.20–1.20)
Total Protein: 7.6 g/dL (ref 6.4–8.3)

## 2016-06-27 LAB — CBC WITH DIFFERENTIAL/PLATELET
BASO%: 0.6 % (ref 0.0–2.0)
BASOS ABS: 0 10*3/uL (ref 0.0–0.1)
EOS ABS: 0.1 10*3/uL (ref 0.0–0.5)
EOS%: 1.4 % (ref 0.0–7.0)
HEMATOCRIT: 38.9 % (ref 34.8–46.6)
HEMOGLOBIN: 13.5 g/dL (ref 11.6–15.9)
LYMPH#: 2.6 10*3/uL (ref 0.9–3.3)
LYMPH%: 41.8 % (ref 14.0–49.7)
MCH: 31 pg (ref 25.1–34.0)
MCHC: 34.7 g/dL (ref 31.5–36.0)
MCV: 89.2 fL (ref 79.5–101.0)
MONO#: 0.4 10*3/uL (ref 0.1–0.9)
MONO%: 7 % (ref 0.0–14.0)
NEUT#: 3.1 10*3/uL (ref 1.5–6.5)
NEUT%: 49.2 % (ref 38.4–76.8)
PLATELETS: 300 10*3/uL (ref 145–400)
RBC: 4.36 10*6/uL (ref 3.70–5.45)
RDW: 13.4 % (ref 11.2–14.5)
WBC: 6.3 10*3/uL (ref 3.9–10.3)

## 2016-06-27 LAB — LACTATE DEHYDROGENASE: LDH: 158 U/L (ref 125–245)

## 2016-06-27 NOTE — Assessment & Plan Note (Signed)
She has diffuse lymphadenopathy on palpation, along with weight loss and fatigue, worrisome for possible undiagnosed lymphoma. Other potential causes would be undiagnosed infection, thyroid disorder or autoimmune disorder I will order additional workup and recommend CT imaging study for further evaluation. She agreed to proceed

## 2016-06-27 NOTE — Progress Notes (Signed)
[facility] NOTE  Patient Care Team: Leanna Battles, MD as PCP - General (Internal Medicine)  CHIEF COMPLAINTS/PURPOSE OF CONSULTATION:  Diffuse lymphadenopathy, recent weight loss and fatigue  HISTORY OF PRESENTING ILLNESS:  Haley Watson 40 y.o. female is here because of multiple symptoms as above. She is accompanied by her husband today. The patient had progressive palpable lymphadenopathy for approximately 3 months. She palpated a lymph node in the right axilla, in the left occipital region and bilateral inguinal region. She also have anorexia, 10 pound weight loss and profound fatigue. Denies night sweats. She have occasional skin rash. Denies joint pain. She complained of epigastric discomfort/burning sensation that comes and goes. She have frequent bowel movement but denies diarrhea. Subsequently, she underwent mammogram imaging study which revealed abnormality in the axillary region, confirmed on ultrasound. According to the radiologist, there is concern for possible lymphoproliferative disorder and she was subsequently referred here for further evaluation   MEDICAL HISTORY:  Past Medical History:  Diagnosis Date  . Insomnia   . Situational depression   . Tobacco abuse    cigarette smoker  . Vitamin D deficiency     SURGICAL HISTORY: Past Surgical History:  Procedure Laterality Date  . DENTAL SURGERY    . KNEE ARTHROSCOPY      SOCIAL HISTORY: Social History   Social History  . Marital status: Married    Spouse name: Dexter  . Number of children: 2  . Years of education: college   Occupational History  . 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 Smoking status: Current Every Day Smoker    Packs/day: 1.00    Years: 21.00    Types: Cigarettes  . Smokeless tobacco: Never Used  . Alcohol use 0.0 oz/week     Comment: 2x a year  . Drug use: No  . Sexual activity: Not on file   Other Topics Concern   . Not on file   Social History Narrative   Drinks 5 caffeine drinks a day     FAMILY HISTORY: Family History  Problem Relation Age of Onset  . Other Mother     Brain tumor  . Cancer Mother 54    brain cancer  . Coronary artery disease Father   . Sleep apnea Father   . Cancer Brother 54    ?    ALLERGIES:  has No Known Allergies.  MEDICATIONS:  Current Outpatient Prescriptions  Medication Sig Dispense Refill  . escitalopram (LEXAPRO) 5 MG tablet     . vitamin B-12 (CYANOCOBALAMIN) 1000 MCG tablet Take 1,000 mcg by mouth daily.    . Vitamin D, Ergocalciferol, (DRISDOL) 50000 units CAPS capsule Take 50,000 Units by mouth every 7 (seven) days.     No current facility-administered medications for this visit.     REVIEW OF SYSTEMS:   Constitutional: Denies fevers, chills or abnormal night sweats Eyes: Denies blurriness of vision, double vision or watery eyes Ears, nose, mouth, throat, and face: Denies mucositis or sore throat Respiratory: Denies cough, dyspnea or wheezes Cardiovascular: Denies palpitation, chest discomfort or lower extremity swelling Neurological:Denies numbness, tingling or new weaknesses Behavioral/Psych: Mood is stable, no new changes  All other systems were reviewed with the patient and are negative.  PHYSICAL EXAMINATION: ECOG PERFORMANCE STATUS: 1 - Symptomatic but completely ambulatory  Vitals:   06/27/16 1403  BP: 104/73  Pulse: 68  Resp: 18  Temp: 98.1 F (36.7 C)  Filed Weights   06/27/16 1403  Weight: 114 lb (51.7 kg)    GENERAL:alert, no distress and comfortable. She looks thin SKIN: skin color, texture, turgor are normal, no rashes or significant lesions EYES: normal, conjunctiva are pink and non-injected, sclera clear OROPHARYNX:no exudate, no erythema and lips, buccal mucosa, and tongue normal  NECK: supple, thyroid normal size, non-tender, without nodularity LYMPH:  She has palpable lymphadenopathy in the cervical, axillary  and inguinal region LUNGS: clear to auscultation and percussion with normal breathing effort HEART: regular rate & rhythm and no murmurs and no lower extremity edema ABDOMEN:abdomen soft, non-tender and normal bowel sounds. No palpable splenomegaly Musculoskeletal:no cyanosis of digits and no clubbing  PSYCH: alert & oriented x 3 with fluent speech NEURO: no focal motor/sensory deficits  LABORATORY DATA:  I have reviewed the data as listed in scanned media  RADIOGRAPHIC STUDIES: I have reviewed the radiological images and agreed with the findings in the report.   ASSESSMENT & PLAN:  Diffuse lymphadenopathy She has diffuse lymphadenopathy on palpation, along with weight loss and fatigue, worrisome for possible undiagnosed lymphoma. Other potential causes would be undiagnosed infection, thyroid disorder or autoimmune disorder I will order additional workup and recommend CT imaging study for further evaluation. She agreed to proceed  Loss of weight Her symptoms of weight loss is worrisome for undiagnosed malignancy As above, will order imaging studies  Other fatigue She has profound fatigue. She looks a bit pale. Her recent blood work done a month ago did not reveal anemia. I will repeat blood tests again along with TSH  Tobacco abuse I spent some time counseling the patient the importance of tobacco cessation. she is currently attempting to quit on her own  Orders Placed This Encounter  Procedures  . CT CHEST W CONTRAST    Standing Status:   Future    Standing Expiration Date:   08/01/2017    Order Specific Question:   Reason for exam:    Answer:   diffuse lymphadenopathy suspect lymphoma    Order Specific Question:   Is the patient pregnant?    Answer:   No    Order Specific Question:   Preferred imaging location?    Answer:   [facility]  . CT ABDOMEN PELVIS W CONTRAST    Standing Status:   Future    Standing Expiration Date:   08/01/2017    Order Specific  Question:   Reason for exam:    Answer:   diffuse lymphadenopathy suspect lymphoma    Order Specific Question:   Is the patient pregnant?    Answer:   No    Order Specific Question:   Preferred imaging location?    Answer:   [facility]  . CT Soft Tissue Neck W Contrast    Standing Status:   Future    Standing Expiration Date:   08/01/2017    Order Specific Question:   If indicated for the ordered procedure, I authorize the administration of contrast media per Radiology protocol    Answer:   Yes    Order Specific Question:   Reason for Exam (SYMPTOM  OR DIAGNOSIS REQUIRED)    Answer:   diffuse lymphadenopathy suspect lymphoma    Order Specific Question:   Is patient pregnant?    Answer:   No    Order Specific Question:   Preferred imaging location?    Answer:   [facility]  . CBC with Differential/Platelet    Standing Status:  Future    Standing Expiration Date:   08/01/2017  . Comprehensive metabolic panel    Standing Status:   Future    Standing Expiration Date:   08/01/2017  . Lactate dehydrogenase    Standing Status:   Future    Standing Expiration Date:   08/01/2017  . Sedimentation rate    Standing Status:   Future    Standing Expiration Date:   08/01/2017  . HIV antibody (with reflex)    Standing Status:   Future    Standing Expiration Date:   08/01/2017  . ANA, IFA (with reflex)    Standing Status:   Future    Standing Expiration Date:   06/27/2017  . TSH    Standing Status:   Future    Standing Expiration Date:   08/01/2017     All questions were answered. The patient knows to call the clinic with any problems, questions or concerns. I spent 40 minutes counseling the patient face to face. The total time spent in the appointment was 55 minutes and more than 50% was on counseling.     Heath Lark, MD 06/27/2016 2:43 PM

## 2016-06-27 NOTE — Assessment & Plan Note (Signed)
Her symptoms of weight loss is worrisome for undiagnosed malignancy As above, will order imaging studies

## 2016-06-27 NOTE — Telephone Encounter (Signed)
Lab appointment added for today per 06/27/16 los. Appointments scheduled per 06/27/16 los. Copy of AVS report and appointment schedule was given to patient, per 06/27/16 los.

## 2016-06-27 NOTE — Assessment & Plan Note (Signed)
I spent some time counseling the patient the importance of tobacco cessation. she is currently attempting to quit on her own

## 2016-06-27 NOTE — Assessment & Plan Note (Signed)
She has profound fatigue. She looks a bit pale. Her recent blood work done a month ago did not reveal anemia. I will repeat blood tests again along with TSH

## 2016-06-28 LAB — ANTINUCLEAR ANTIBODIES, IFA: ANTINUCLEAR ANTIBODIES, IFA: NEGATIVE

## 2016-06-28 LAB — HIV ANTIBODY (ROUTINE TESTING W REFLEX): HIV Screen 4th Generation wRfx: NONREACTIVE

## 2016-06-28 LAB — SEDIMENTATION RATE: Sedimentation Rate-Westergren: 2 mm/hr (ref 0–32)

## 2016-06-28 LAB — TSH: TSH: 1.612 m(IU)/L (ref 0.308–3.960)

## 2016-07-04 ENCOUNTER — Encounter (HOSPITAL_COMMUNITY): Payer: Self-pay

## 2016-07-04 ENCOUNTER — Ambulatory Visit (HOSPITAL_COMMUNITY)
Admission: RE | Admit: 2016-07-04 | Discharge: 2016-07-04 | Disposition: A | Discharge status: Home / Self Care | Payer: BC Managed Care – PPO | Source: Ambulatory Visit | Attending: Hematology and Oncology | Admitting: Hematology and Oncology

## 2016-07-04 DIAGNOSIS — I868 Varicose veins of other specified sites: Secondary | ICD-10-CM | POA: Insufficient documentation

## 2016-07-04 DIAGNOSIS — R591 Generalized enlarged lymph nodes: Secondary | ICD-10-CM | POA: Diagnosis present

## 2016-07-04 DIAGNOSIS — D1803 Hemangioma of intra-abdominal structures: Secondary | ICD-10-CM | POA: Insufficient documentation

## 2016-07-04 MED ORDER — SODIUM CHLORIDE 0.9 % IJ SOLN
INTRAMUSCULAR | Status: AC
  Filled 2016-07-04: qty 50

## 2016-07-04 MED ORDER — IOPAMIDOL (ISOVUE-300) INJECTION 61%
100.0000 mL | Freq: Once | INTRAVENOUS | Status: AC | PRN
  Administered 2016-07-04: 100 mL via INTRAVENOUS

## 2016-07-04 MED ORDER — IOPAMIDOL (ISOVUE-300) INJECTION 61%
INTRAVENOUS | Status: AC
  Filled 2016-07-04: qty 100

## 2016-07-05 ENCOUNTER — Encounter: Payer: Self-pay | Admitting: Hematology and Oncology

## 2016-07-05 ENCOUNTER — Ambulatory Visit (HOSPITAL_BASED_OUTPATIENT_CLINIC_OR_DEPARTMENT_OTHER): Payer: BC Managed Care – PPO | Admitting: Hematology and Oncology

## 2016-07-05 DIAGNOSIS — R591 Generalized enlarged lymph nodes: Secondary | ICD-10-CM | POA: Diagnosis not present

## 2016-07-05 NOTE — Assessment & Plan Note (Signed)
CT scan showed no significant lymphadenopathy. All her workup for immune disorder, infectious and lymphoma were within normal limits. I reassured the patient that she does not have anything to suggest cancer. She does not need further workup

## 2016-07-05 NOTE — Progress Notes (Signed)
[place], [place] SUMMARY OF HEMATOLOGIC HISTORY:  Haley Watson 40 y.o. female is here because of multiple symptoms as above. She is accompanied by her husband today. The patient had progressive palpable lymphadenopathy for approximately 3 months. She palpated a lymph node in the right axilla, in the left occipital region and bilateral inguinal region. She also have anorexia, 10 pound weight loss and profound fatigue. Denies night sweats. She have occasional skin rash. Denies joint pain. She complained of epigastric discomfort/burning sensation that comes and goes. She have frequent bowel movement but denies diarrhea. Subsequently, she underwent mammogram imaging study which revealed abnormality in the axillary region, confirmed on ultrasound. According to the radiologist, there is concern for possible lymphoproliferative disorder and she was subsequently referred here for further evaluation  INTERVAL HISTORY: Haley Watson 40 y.o. female returns for follow-up. She feels well. She has no further weight loss.  I have reviewed the past medical history, past surgical history, social history and family history with the patient and they are unchanged from previous note.  ALLERGIES:  has No Known Allergies.  MEDICATIONS:  Current Outpatient Prescriptions  Medication Sig Dispense Refill  . escitalopram (LEXAPRO) 5 MG tablet     . vitamin B-12 (CYANOCOBALAMIN) 1000 MCG tablet Take 1,000 mcg by mouth daily.    . Vitamin D, Ergocalciferol, (DRISDOL) 50000 units CAPS capsule Take 50,000 Units by mouth every 7 (seven) days.     No current facility-administered medications for this visit.      REVIEW OF SYSTEMS:   Constitutional: Denies fevers, chills or night sweats Eyes: Denies blurriness of vision Ears, nose, mouth, throat, and face: Denies mucositis or sore throat Respiratory: Denies cough, dyspnea or wheezes Cardiovascular:  Denies palpitation, chest discomfort or lower extremity swelling Gastrointestinal:  Denies nausea, heartburn or change in bowel habits Skin: Denies abnormal skin rashes Lymphatics: Denies new lymphadenopathy or easy bruising Neurological:Denies numbness, tingling or new weaknesses Behavioral/Psych: Mood is stable, no new changes  All other systems were reviewed with the patient and are negative.  PHYSICAL EXAMINATION: ECOG PERFORMANCE STATUS: 0 - Asymptomatic  Vitals:   07/05/16 1121  BP: 115/70  Pulse: 70  Resp: 18  Temp: 97.8 F (36.6 C)   Filed Weights   07/05/16 1121  Weight: 116 lb 6.4 oz (52.8 kg)    GENERAL:alert, no distress and comfortable SKIN: skin color, texture, turgor are normal, no rashes or significant lesions Musculoskeletal:no cyanosis of digits and no clubbing  NEURO: alert & oriented x 3 with fluent speech, no focal motor/sensory deficits  LABORATORY DATA:  I have reviewed the data as listed     Component Value Date/Time   NA 141 06/27/2016 1512   K 4.4 06/27/2016 1512   CO2 25 06/27/2016 1512   GLUCOSE 91 06/27/2016 1512   BUN 7.0 06/27/2016 1512   CREATININE 0.8 06/27/2016 1512   CALCIUM 10.0 06/27/2016 1512   PROT 7.6 06/27/2016 1512   ALBUMIN 4.5 06/27/2016 1512   AST 22 06/27/2016 1512   ALT 15 06/27/2016 1512   ALKPHOS 46 06/27/2016 1512   BILITOT 0.55 06/27/2016 1512    No results found for: SPEP, UPEP  Lab Results  Component Value Date   WBC 6.3 06/27/2016   NEUTROABS 3.1 06/27/2016   HGB 13.5 06/27/2016   HCT 38.9 06/27/2016   MCV 89.2 06/27/2016   PLT 300 06/27/2016      Chemistry      Component Value  Date/Time   NA 141 06/27/2016 1512   K 4.4 06/27/2016 1512   CO2 25 06/27/2016 1512   BUN 7.0 06/27/2016 1512   CREATININE 0.8 06/27/2016 1512      Component Value Date/Time   CALCIUM 10.0 06/27/2016 1512   ALKPHOS 46 06/27/2016 1512   AST 22 06/27/2016 1512   ALT 15 06/27/2016 1512   BILITOT 0.55 06/27/2016 1512        RADIOGRAPHIC STUDIES: I have personally reviewed the radiological images as listed and agreed with the findings in the report. Ct Soft Tissue Neck W Contrast  Result Date: 07/04/2016 CLINICAL DATA:  Diffuse lymphadenopathy suspicious for lymphoma. EXAM: CT NECK WITH CONTRAST TECHNIQUE: Multidetector CT imaging of the neck was performed using the standard protocol following the bolus administration of intravenous contrast. CONTRAST:  147m ISOVUE-300 IOPAMIDOL (ISOVUE-300) INJECTION 61% COMPARISON:  None. FINDINGS: Pharynx and larynx: Normal. No mass or swelling. Salivary glands: No inflammation, mass, or stone. Thyroid: Normal. Lymph nodes: None enlarged or abnormal density. Vascular: Minimal atherosclerotic plaque on the proximal left subclavian artery. Limited intracranial: Negative. Visualized orbits: Negative. Mastoids and visualized paranasal sinuses: Mild mucosal thickening on the left maxillary sinus floor. Skeleton: No acute or aggressive process. Upper chest: Reported separately IMPRESSION: 1. Benign appearance of the neck.  No suspicious lymph nodes. 2. Chest and abdomen CT reported separately. Electronically Signed   By: JMonte FantasiaM.D.   On: 07/04/2016 12:42   Ct Chest W Contrast  Result Date: 07/04/2016 CLINICAL DATA:  Follow up lymphadenopathy. Suspected lymphoma. Bilateral neck pain with cough, shortness of breath, left upper quadrant abdominal pain and nausea. EXAM: CT CHEST, ABDOMEN, AND PELVIS WITH CONTRAST TECHNIQUE: Multidetector CT imaging of the chest, abdomen and pelvis was performed following the standard protocol during bolus administration of intravenous contrast. CONTRAST:  1053mISOVUE-300 IOPAMIDOL (ISOVUE-300) INJECTION 61% COMPARISON:  Abdominal pelvic CT 02/20/2015. Today CT of the neck is dictated separately. FINDINGS: CT CHEST FINDINGS Cardiovascular: There are no significant vascular findings. The heart size is normal. There is no pericardial effusion.  Mediastinum/Nodes: There are no enlarged mediastinal, hilar or axillary lymph nodes. There is a small amount residual thymic tissue. The thyroid gland, trachea and esophagus demonstrate no significant findings. Lungs/Pleura: There is no pleural effusion. The lungs are clear. Musculoskeletal/Chest wall: No chest wall mass or suspicious osseous findings. CT ABDOMEN AND PELVIS FINDINGS Hepatobiliary: There is a stable hemangioma peripherally in the right hepatic lobe, measuring 2.2 x 2.2 cm on image 69. No worrisome hepatic findings. No evidence of gallstones, gallbladder wall thickening or biliary dilatation. Pancreas: Unremarkable. No pancreatic ductal dilatation or surrounding inflammatory changes. Spleen: Normal in size without focal abnormality. Adrenals/Urinary Tract: Both adrenal glands appear normal. The kidneys appear normal without evidence of urinary tract calculus, suspicious lesion or hydronephrosis. No bladder abnormalities are seen. Stomach/Bowel: No evidence of bowel wall thickening, distention or surrounding inflammatory change. Vascular/Lymphatic: There are no enlarged abdominal or pelvic lymph nodes. No acute vascular findings are seen. 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 Reproductive: The uterus and ovaries appear unremarkable. No evidence of adnexal mass. As above, left gonadal vein varices. Other: No evidence of abdominal wall mass or hernia. No ascites. Musculoskeletal: No acute or significant osseous findings. IMPRESSION: 1. No lymphadenopathy within the chest, abdomen or pelvis suspicious for lymphoma. The spine appears normal. 2. Stable hemangioma within the right hepatic lobe. 3. Left gonadal vein varices. Electronically Signed   By: WiRichardean Sale.D.   On:  07/04/2016 14:37   Ct Abdomen Pelvis W Contrast  Result Date: 07/04/2016 CLINICAL DATA:  Follow up lymphadenopathy. Suspected lymphoma. Bilateral neck pain with cough, shortness of breath, left upper quadrant  abdominal pain and nausea. EXAM: CT CHEST, ABDOMEN, AND PELVIS WITH CONTRAST TECHNIQUE: Multidetector CT imaging of the chest, abdomen and pelvis was performed following the standard protocol during bolus administration of intravenous contrast. CONTRAST:  171m ISOVUE-300 IOPAMIDOL (ISOVUE-300) INJECTION 61% COMPARISON:  Abdominal pelvic CT 02/20/2015. Today CT of the neck is dictated separately. FINDINGS: CT CHEST FINDINGS Cardiovascular: There are no significant vascular findings. The heart size is normal. There is no pericardial effusion. Mediastinum/Nodes: There are no enlarged mediastinal, hilar or axillary lymph nodes. There is a small amount residual thymic tissue. The thyroid gland, trachea and esophagus demonstrate no significant findings. Lungs/Pleura: There is no pleural effusion. The lungs are clear. Musculoskeletal/Chest wall: No chest wall mass or suspicious osseous findings. CT ABDOMEN AND PELVIS FINDINGS Hepatobiliary: There is a stable hemangioma peripherally in the right hepatic lobe, measuring 2.2 x 2.2 cm on image 69. No worrisome hepatic findings. No evidence of gallstones, gallbladder wall thickening or biliary dilatation. Pancreas: Unremarkable. No pancreatic ductal dilatation or surrounding inflammatory changes. Spleen: Normal in size without focal abnormality. Adrenals/Urinary Tract: Both adrenal glands appear normal. The kidneys appear normal without evidence of urinary tract calculus, suspicious lesion or hydronephrosis. No bladder abnormalities are seen. Stomach/Bowel: No evidence of bowel wall thickening, distention or surrounding inflammatory change. Vascular/Lymphatic: There are no enlarged abdominal or pelvic lymph nodes. No acute vascular findings are seen. 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 Reproductive: The uterus and ovaries appear unremarkable. No evidence of adnexal mass. As above, left gonadal vein varices. Other: No evidence of abdominal wall mass or hernia.  No ascites. Musculoskeletal: No acute or significant osseous findings. IMPRESSION: 1. No lymphadenopathy within the chest, abdomen or pelvis suspicious for lymphoma. The spine appears normal. 2. Stable hemangioma within the right hepatic lobe. 3. Left gonadal vein varices. Electronically Signed   By: WRichardean SaleM.D.   On: 07/04/2016 14:37    ASSESSMENT & PLAN:  Diffuse lymphadenopathy CT scan showed no significant lymphadenopathy. All her workup for immune disorder, infectious and lymphoma were within normal limits. I reassured the patient that she does not have anything to suggest cancer. She does not need further workup   No orders of the defined types were placed in this encounter.   All questions were answered. The patient knows to call the clinic with any problems, questions or concerns. No barriers to learning was detected.  I spent 10 minutes counseling the patient face to face. The total time spent in the appointment was 15 minutes and more than 50% was on counseling.     NHeath Lark MD 11/21/20171:49 PM

## 2016-11-21 ENCOUNTER — Telehealth: Payer: Self-pay

## 2016-11-21 NOTE — Telephone Encounter (Signed)
Pt was called on 06/13/16, 06/23/16, 06/29/16 to schedule sleep study.

## 2021-08-27 ENCOUNTER — Other Ambulatory Visit: Payer: Self-pay | Admitting: Adult Medicine

## 2021-08-27 DIAGNOSIS — Z1231 Encounter for screening mammogram for malignant neoplasm of breast: Secondary | ICD-10-CM

## 2023-03-18 NOTE — Progress Notes (Unsigned)
NEW PATIENT Date of Service/Encounter:  03/20/23 Referring provider: Eliezer Lofts, MD Primary care provider: Eliezer Lofts, MD  Subjective:  Haley Watson is a 47 y.o. female with a PMHx of fatigue, headaches, tobacco abuse presenting today for evaluation of urticaria History obtained from: chart review and patient.   Rash:  started on and off for years, occurring anywhere from daily or multiple times per month depending on stress or rest Now when they disappear they will leave bruising which is painful. When it is hives, it itches.  Denies other systemic symptoms including no respiratory, gastrointestinal or cardiovascular distress. No changes to personal care products, detergents, diet, medications etc Therapies tried: benadryl PRN, vitamins and eating better Potential triggers: cold, heat, stress Does not associate fever, joint pain, joint swelling, weight loss.  Lesions resolve in 24 hours to 3 days.   Pictures reviewed and some are consistent with typical chronic urticaria, there are pictures on her back showing bruising concerning for vasculitic urticaria. She has not had a biopsy.  She has had some autoimmune labs drawn by PCP but those are not available for review.   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 She believes her labs were normal for ANA and ANCA.   Other allergy screening: Asthma: no Rhino conjunctivitis: no Food allergy: no Medication allergy: no Hymenoptera allergy: no Eczema: mild eczema in childhood, resolved -was told oranges and strawberries cause her to   Past Medical History: Past Medical History:  Diagnosis Date   Insomnia    Situational depression    Tobacco abuse    cigarette smoker   Urticaria    Vitamin D deficiency     Medication List:  Current Outpatient Medications  Medication Sig Dispense Refill   diphenhydrAMINE (BENADRYL) 12.5 MG chewable tablet Chew 12.5 mg by mouth 4 (four) times daily as needed.     escitalopram (LEXAPRO) 5 MG tablet  (Patient not taking: Reported on 03/20/2023)     vitamin B-12 (CYANOCOBALAMIN) 1000 MCG tablet Take 1,000 mcg by mouth daily. (Patient not taking: Reported on 03/20/2023)     Vitamin D, Ergocalciferol, (DRISDOL) 50000 units CAPS capsule Take 50,000 Units by mouth every 7 (seven) days. (Patient not taking: Reported on 03/20/2023)     No current facility-administered medications for this visit.   Known Allergies:  No Known Allergies Past Surgical History: Past Surgical History:  Procedure Laterality Date   DENTAL SURGERY     KNEE ARTHROSCOPY     Family History: Family History  Problem Relation Age of Onset   Other Mother        Brain tumor   Cancer Mother 66       brain cancer   Coronary artery disease Father    Sleep apnea Father    Cancer Brother 25       ?   Social History: Sharalyn lives in a house built 55 years ago, + water damage, wood floors, gas heating, central AC, dog and cat indoors, no roaches, using DM protection on mattress not pillows, no smoke exposure. She is a Dietitian. She smokes 1 PDD x 20 years.. No HEPA filter in home. [place]..   ROS:  All other systems negative except as noted per HPI.  Objective:  Blood pressure 98/66, pulse 70, temperature 98.3 F (  36.8 C), resp. rate 14, height 5\' 7"  (1.702 m), weight 112 lb 14.4 oz (51.2 kg), SpO2 99%. Body mass index is 17.68 kg/m. Physical Exam:  General Appearance:  Alert, cooperative, no distress, appears stated age  Head:  Normocephalic, without obvious abnormality, atraumatic  Eyes:  Conjunctiva clear, EOM's intact  Ears EACs normal bilaterally and normal TMs bilaterally  Nose: Nares normal, normal mucosa and no visible anterior polyps   Throat: Lips, tongue normal; teeth and gums normal, normal posterior oropharynx  Neck: Supple, symmetrical  Lungs:   clear to auscultation bilaterally, Respirations unlabored, no coughing  Heart:  regular rate and rhythm and no murmur, Appears well perfused  Extremities: No edema  Skin: Skin color, texture, turgor normal and no rashes or lesions on visualized portions of skin  Neurologic: No gross deficits   Diagnostics:  Labs:  Lab Orders         Alpha-Gal Panel         Chronic Urticaria         Sedimentation rate         Thyroid antibodies         Tryptase         TSH + free T4       Assessment and Plan  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  We are going to refer you to dermatology for consideration of a biopsy of the bruised areas. - this is defined as hives lasting more than 6 weeks without an identifiable trigger - hives can be from a number of different sources including infections, allergies, vibration, temperature, pressure among many others other possible causes - often an identifiable cause is not determined - some potential triggers include: stress, illness, NSAIDs, aspirin, hormonal changes - you do not have any red flag symptoms to make [place] concerned about secondary causes of hives, but we will screen for these for reassurance with: tryptase, "CBCd, ESR, thryoid labs, alpha gal-mammalian meat delayed food allergy, and CU index-often elevated in autoimmune hives". - approximately 50% of patients with chronic hives can have some associated swelling of the face/lips/eyelids (this is not a cause for alarm and does not typically progress onto systemic allergic reactions)  Therapy Plan:  - start zyrtec (cetirizine) to 10mg  twice daily - if hives remain uncontrolled, increase dose of zyrtec (cetirizine) to max dose of 20mg  (2 pills) twice daily- this is maximum dose - can increase or decrease  dosing depending on symptom control to a maximum dose of 4 tablets of antihistamine daily. Wait until hives free for at least one month prior to decreasing dose.   - if hives are still uncontrolled with the above regimen, please arrange an appointment for discussion of Xolair (omalizumab)- an injectable medication for hives  Can use one of the following in place of zyrtec if desires: Claritin (loratadine) 10 mg, Xyzal (levocetirizine) 5 mg or Allegra (fexofenadine) 180 mg daily as needed  Follow up : 4 to 6 weeks, sooner if needed It was a pleasure meeting you in clinic today! Thank you for allowing me to participate in your care.  This note in its entirety was forwarded to the Provider who requested this consultation.  Other: release forms signed to obtain results from June labs from PCP.  Thank you for your kind referral. I appreciate the opportunity to take part in Diania's care. Please do not hesitate to contact me with questions.  Sincerely,  Denny Peon  Maurine Minister, MD [facility]

## 2023-03-20 ENCOUNTER — Encounter: Payer: Self-pay | Admitting: Internal Medicine

## 2023-03-20 ENCOUNTER — Other Ambulatory Visit: Payer: Self-pay

## 2023-03-20 ENCOUNTER — Ambulatory Visit: Payer: BC Managed Care – PPO | Admitting: Internal Medicine

## 2023-03-20 VITALS — BP 98/66 | HR 70 | Temp 98.3°F | Resp 14 | Ht 67.0 in | Wt 112.9 lb

## 2023-03-20 DIAGNOSIS — L508 Other urticaria: Secondary | ICD-10-CM

## 2023-03-20 NOTE — Patient Instructions (Addendum)
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  We are going to refer you to dermatology for consideration of a biopsy of the bruised areas. We are going to obtain results of labs from PCP. - this is defined as hives lasting more than 6 weeks without an identifiable trigger - hives can be from a number of different sources including infections, allergies, vibration, temperature, pressure among many others other possible causes - often an identifiable cause is not determined - some potential triggers include: stress, illness, NSAIDs, aspirin, hormonal changes - you do not have any red flag symptoms to make [place] concerned about secondary causes of hives, but we will screen for these for reassurance with:  tryptase, "CBCd, ESR, thryoid labs, alpha gal-mammalian meat delayed food allergy, and CU index-often elevated in autoimmune hives". - approximately 50% of patients with chronic hives can have some associated swelling of the face/lips/eyelids (this is not a cause for alarm and does not typically progress onto systemic allergic reactions)  Therapy Plan:  - start zyrtec (cetirizine) to 10mg  twice daily - if hives remain uncontrolled, increase dose of zyrtec (cetirizine) to max dose of 20mg  (2 pills) twice daily- this is maximum dose - can increase or decrease dosing depending on symptom control to a maximum dose of 4 tablets of antihistamine daily. Wait until hives free for at least one month prior to decreasing dose.   - if hives are still uncontrolled with the above regimen, please arrange an appointment for discussion of Xolair (omalizumab)- an injectable medication for hives  Can use one of the following in place of zyrtec if desires: Claritin (loratadine) 10 mg, Xyzal (levocetirizine) 5 mg or Allegra (fexofenadine) 180 mg daily as needed  Follow up : 4 to 6 weeks, sooner if needed It was a pleasure meeting you in clinic  today! Thank you for allowing me to participate in your care.  For stress relief/healthy living. Consider reading Outlive by Dr. Loyal Gambler And Self-Compassion by Dr. Clovia Cuff  Tonny Bollman, MD [facility]

## 2023-03-27 NOTE — Progress Notes (Signed)
Please let Haley Watson know that her hives labs have now all returned.  Her thyroid studies look great. She does not have alpha gal (rare mammalian meat allergy) and her inflammatory markers look great. Her CU index (which is a marker for autoimmune hives) is slightly elevated. This does not change our management plan, but can signal harder to treat hives for some people.  I am still waiting to review her lab work from her primary care doctor.  I do still want her to see dermatology for a biopsy.  I look forward to seeing her in follow-up.  Let me know if she has any questions.  Can we also check to see whether or not we have received the recent lab work from her primary care provider?  I have not seen it in my inbox for review.

## 2023-03-28 ENCOUNTER — Telehealth: Payer: Self-pay

## 2023-03-28 NOTE — Telephone Encounter (Signed)
Spoke to patient on the phone about lab results and went over where the referral was placed and that she would need to call that office to make an appointment for the biopsy. Patient stated that she understood, wrote the number down, and stated that she would all that practice to schedule an appointment.

## 2023-03-28 NOTE — Telephone Encounter (Signed)
-----   Message from Verlee Monte sent at 03/20/2023 12:46 PM EDT ----- Can we refer to derm for c/f vasculitic urticaria, please consider biopsy.

## 2023-03-28 NOTE — Telephone Encounter (Signed)
Referral has been placed in epic 

## 2023-03-30 NOTE — Telephone Encounter (Signed)
Called [facility] and was able to get patient scheduled sooner. Patient is now scheduled for 04-25-23 with Dr. Onalee Hua.   Called patient and advised her of appointment. Patient verbalized understanding and expressed her gratitude.   Referral has also been updated as urgent.

## 2023-03-30 NOTE — Telephone Encounter (Signed)
Thank you Carla . 

## 2023-04-03 ENCOUNTER — Encounter: Payer: Self-pay | Admitting: Internal Medicine

## 2023-04-03 NOTE — Progress Notes (Signed)
Received patient's labs from PCP.  CMP within normal limits.  Normal ferritin.  Elevated TIBC.  Undetectable CRP negative ANA elevated FSH, normal LH normal vitamin D, normal iPTH, reassuring CBC with differential, A1c slightly elevated 5.7, elevated total cholesterol 256, triglycerides 226, HDL 96, LDL 115, VLDL 45.  Normal TSH, T3 and free T4, nonreactive hep B surface antigen.  Labs to be scanned to patient's chart.  Overall these labs are very reassuring.  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  Awaiting dermatology referral and possible biopsy.  In the meantime we are managing as chronic idiopathic urticaria.

## 2023-04-24 ENCOUNTER — Other Ambulatory Visit: Payer: Self-pay

## 2023-04-24 ENCOUNTER — Encounter: Payer: Self-pay | Admitting: Internal Medicine

## 2023-04-24 ENCOUNTER — Ambulatory Visit: Payer: BC Managed Care – PPO | Admitting: Internal Medicine

## 2023-04-24 VITALS — BP 118/70 | HR 79 | Temp 98.0°F | Resp 18 | Ht 66.54 in | Wt 123.7 lb

## 2023-04-24 DIAGNOSIS — L508 Other urticaria: Secondary | ICD-10-CM | POA: Diagnosis not present

## 2023-04-24 NOTE — Patient Instructions (Addendum)
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  Continue with plan to see Dermatology - this is defined as hives lasting more than 6 weeks without an identifiable trigger - hives can be from a number of different sources including infections, allergies, vibration, temperature, pressure among many others other possible causes - often an identifiable cause is not determined - some potential triggers include: stress, illness, NSAIDs, aspirin, hormonal changes - labs previously obtained overall reassuring except your autoimmune hives marker was slightly elevated. - approximately 50% of patients with chronic hives can have some associated swelling of the face/lips/eyelids (this is not a cause for alarm and does not typically progress onto systemic allergic reactions)  Therapy Plan: If hives become bothersome - start zyrtec (cetirizine) at 10 mg twice daily-can order on [employer] for better pricing or try [employer] - if hives remain uncontrolled, increase dose of zyrtec (cetirizine) to max dose of 20mg  (2 pills) twice daily- this is maximum dose - can increase or decrease dosing depending on symptom control to a maximum dose of 4 tablets of antihistamine daily. Wait until hives free for at least one month prior to decreasing dose.   - if hives are still uncontrolled with the above regimen, please arrange an appointment for discussion of Xolair (omalizumab)- an injectable medication for hives  Can use one of the following in place of zyrtec if desires: Claritin (loratadine) 10 mg, Xyzal (levocetirizine) 5 mg or Allegra (fexofenadine) 180 mg daily as needed  Follow up : 6 months, sooner if needed It was a pleasure seeing you again in clinic today! Thank you for allowing me to participate in your care.   Tonny Bollman, MD [facility]

## 2023-04-24 NOTE — Progress Notes (Signed)
FOLLOW UP Date of Service/Encounter:  04/24/23  Subjective:  Haley Watson (DOB: [birth year]) is a 47 y.o. female who [facility] in re-evaluation of the following: urticaria History obtained from: chart review and patient.  For Review, LV was on 03/20/23  with Dr.Solash Tullo seen for intial visit for urticaria-vasculitic vs idiopathic . See below for summary of history and diagnostics.   Therapeutic plans/changes recommended: labs-started high dose antihistamines ----------------------------------------------------- Pertinent History/Diagnostics:  Urticaria:   on and off for years, occurring anywhere from daily or multiple times per month depending on stress. Most recently starting to leave significant bruising on back.  When in hives phase, very itchy. No other systemic symptoms. No red flag symptoms. No changes to personal care products, detergents, diet, medications etc. Resolve in 24 hours to 3 days.  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  - labs (01/23/23): slightly elevated CU index 11.7 (slightly elevated), ESR 2 (normal), negative thyroid antibodies and normal TSH and fT4, normal baseline tryptase 6.9, negative alpha gal panel -PCP labs reviewed: CMP within normal limits. Normal ferritin. Elevated TIBC. Undetectable CRP negative ANA elevated FSH, normal LH normal vitamin D, normal iPTH, reassuring CBC with differential, A1c slightly elevated 5.7, elevated total cholesterol 256, triglycerides 226, HDL 96, LDL 115, VLDL 45. Normal TSH, T3 and free T4, nonreactive hep B surface antigen.  --------------------------------------------------- Today presents for follow-up. Urticaria has been better controlled since last visit. She is going to see Dermatology tomorrow. Overall she is having hives a few times per week, but now are only lasting a few hours. Tend to occur if she if very  fatigued or if she does exercise.  She is not taking any antihistamines at this moment.  Recently breakouts have been less bothersome and smaller. They continue to be itchy. Otherwise she is doing well. 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   All medications reviewed by clinical staff and updated in chart. No new pertinent medical or surgical history except as noted in HPI.  ROS: All others negative except as noted per HPI.   Objective:  BP 118/70   Pulse 79   Temp 98 F (36.7 C)   Resp 18   Ht 5' 6.54" (1.69 m)   Wt 123 lb 11.2 oz (56.1 kg)   SpO2 99%   BMI 19.65 kg/m  Body mass index is 19.65 kg/m. Physical Exam: General Appearance:  Alert, cooperative, no distress, appears stated age  Head:  Normocephalic, without obvious abnormality, atraumatic  Eyes:  Conjunctiva clear, EOM's intact  Nose: Nares normal, no visible rhinorrhea  Throat: Lips, tongue normal; teeth and gums normal  Neck: Supple, symmetrical  Lungs:   clear to auscultation bilaterally, Respirations unlabored, no coughing  Heart:  regular rate and rhythm and no murmur, Appears well perfused  Extremities: No edema  Skin: Small erythematous raised papules on left outer ankle  Neurologic: No gross deficits   Labs:  Lab Orders  No laboratory test(s) ordered today    Assessment/Plan  Well-controlled since last visit, and no more lesions concerning for vasculitis. She is going to see Dermatology tomorrow. 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  Continue with plan to see Dermatology - this is defined as hives lasting more than 6 weeks without an identifiable  trigger - hives can be from a number of different sources including infections, allergies, vibration, temperature, pressure among many others other possible causes - often an  identifiable cause is not determined - some potential triggers include: stress, illness, NSAIDs, aspirin, hormonal changes - labs previously obtained overall reassuring except your autoimmune hives marker was slightly elevated. - approximately 50% of patients with chronic hives can have some associated swelling of the face/lips/eyelids (this is not a cause for alarm and does not typically progress onto systemic allergic reactions)  Therapy Plan: If hives become bothersome - start zyrtec (cetirizine) at 10 mg twice daily-can order on [employer] for better pricing or try [employer] - if hives remain uncontrolled, increase dose of zyrtec (cetirizine) to max dose of 20mg  (2 pills) twice daily- this is maximum dose - can increase or decrease dosing depending on symptom control to a maximum dose of 4 tablets of antihistamine daily. Wait until hives free for at least one month prior to decreasing dose.   - if hives are still uncontrolled with the above regimen, please arrange an appointment for discussion of Xolair (omalizumab)- an injectable medication for hives  Can use one of the following in place of zyrtec if desires: Claritin (loratadine) 10 mg, Xyzal (levocetirizine) 5 mg or Allegra (fexofenadine) 180 mg daily as needed  Follow up : 6 months, sooner if needed It was a pleasure seeing you again in clinic today! Thank you for allowing me to participate in your care.  Other: none  Tonny Bollman, MD  [facility]

## 2023-04-25 ENCOUNTER — Encounter: Payer: Self-pay | Admitting: Dermatology

## 2023-04-25 ENCOUNTER — Ambulatory Visit (INDEPENDENT_AMBULATORY_CARE_PROVIDER_SITE_OTHER): Payer: BC Managed Care – PPO | Admitting: Dermatology

## 2023-04-25 VITALS — BP 126/81 | HR 71

## 2023-04-25 DIAGNOSIS — L810 Postinflammatory hyperpigmentation: Secondary | ICD-10-CM | POA: Diagnosis not present

## 2023-04-25 DIAGNOSIS — L501 Idiopathic urticaria: Secondary | ICD-10-CM | POA: Diagnosis not present

## 2023-04-25 MED ORDER — TRIAMCINOLONE ACETONIDE 0.1 % EX CREA: TOPICAL_CREAM | CUTANEOUS | 2 refills | Status: DC

## 2023-04-25 MED ORDER — MONTELUKAST SODIUM 10 MG PO TABS: ORAL_TABLET | ORAL | 3 refills | Status: DC

## 2023-04-25 NOTE — Patient Instructions (Addendum)
Hello Haley Watson,  Thank you for visiting our clinic today. We appreciate your commitment to improving your health and managing your chronic urticaria. Here is a summary of the key instructions and recommendations from today's consultation:  - Medications Prescribed:   - Singulair: Take once daily. This antihistamine should not cause drowsiness.   - Triamcinolone Cream: Apply twice daily to any hives to reduce duration and prevent post-inflammatory pigment changes.   - Zyrtec or Allegra: Take one of these in the morning to help manage symptoms.  - Lifestyle Adjustments:   - Avoid known triggers such as tight clothing, stress, and extreme temperatures.   - Continue monitoring for any new triggers, especially related to your dance activities.  - Follow-Up Care:   - We have scheduled a follow-up appointment in six weeks to review the effectiveness of the treatment plan and assess any changes in the pattern and frequency of your hives.   - We will review your previous lab work at the next visit and check for any active lesions that might require a biopsy.  - Additional Notes:   - We are checking for markers for vasculitis to ensure comprehensive care, although your current symptoms do not suggest this condition.   - Keep an eye on any new or worsening symptoms and report them during your follow-up visit.  Thank you once again for your visit today. We look forward to seeing you again and continuing to support your health journey.  Warm regards,  Dr. Langston Reusing Dermatology   Important Information  Due to recent changes in healthcare laws, you may see results of your pathology and/or laboratory studies on MyChart before the doctors have had a chance to review them. We understand that in some cases there may be results that are confusing or concerning to you. Please understand that not all results are received at the same time and often the doctors may need to interpret multiple results in  order to provide you with the best plan of care or course of treatment. Therefore, we ask that you please give [place] 2 business days to thoroughly review all your results before contacting the office for clarification. Should we see a critical lab result, you will be contacted sooner.   If You Need Anything After Your Visit  If you have any questions or concerns for your doctor, please call our main line at [phone] If no one answers, please leave a voicemail as directed and we will return your call as soon as possible. Messages left after 4 pm will be answered the following business day.   You may also send [place] a message via MyChart. We typically respond to MyChart messages within 1-2 business days.  For prescription refills, please ask your pharmacy to contact our office. Our fax number is [phone].  If you have an urgent issue when the clinic is closed that cannot wait until the next business day, you can page your doctor at the number below.    Please note that while we do our best to be available for urgent issues outside of office hours, we are not available 24/7.   If you have an urgent issue and are unable to reach [place], you may choose to seek medical care at your doctor's office, retail clinic, urgent care center, or emergency room.  If you have a medical emergency, please immediately call 911 or go to the emergency department. In the event of inclement weather, please call our main line at [phone] for  an update on the status of any delays or closures.  Dermatology Medication Tips: Please keep the boxes that topical medications come in in order to help keep track of the instructions about where and how to use these. Pharmacies typically print the medication instructions only on the boxes and not directly on the medication tubes.   If your medication is too expensive, please contact our office at [phone] or send [place] a message through MyChart.   We are unable to tell what  your co-pay for medications will be in advance as this is different depending on your insurance coverage. However, we may be able to find a substitute medication at lower cost or fill out paperwork to get insurance to cover a needed medication.   If a prior authorization is required to get your medication covered by your insurance company, please allow [place] 1-2 business days to complete this process.  Drug prices often vary depending on where the prescription is filled and some pharmacies may offer cheaper prices.  The website www.goodrx.com contains coupons for medications through different pharmacies. The prices here do not account for what the cost may be with help from insurance (it may be cheaper with your insurance), but the website can give you the price if you did not use any insurance.  - You can print the associated coupon and take it with your prescription to the pharmacy.  - You may also stop by our office during regular business hours and pick up a GoodRx coupon card.  - If you need your prescription sent electronically to a different pharmacy, notify our office through [facility] or by phone at [phone]

## 2023-04-25 NOTE — Progress Notes (Signed)
New Patient Visit   Subjective  Haley Watson is a 47 y.o. female who presents for the following: hives  Pt states she is here for ongoing problem with hives. She states it has been going on for about 3-4 years on and off. She said extreme cold or heat or tight clothes trigger them. She has had a lot of stress over the past few months which made things very bad. When this first started pt was put on antihistamines that she did not respond to. She said that since April/May time frame when the hives started to go away they would leave bruising. The hives generally last 24 hours.  The following portions of the chart were reviewed this encounter and updated as appropriate: medications, allergies, medical history  Review of Systems:  No other skin or systemic complaints except as noted in HPI or Assessment and Plan.  Objective  Well appearing patient in no apparent distress; mood and affect are within normal limits.   A focused examination was performed of the following areas: All over  Relevant exam findings are noted in the Assessment and Plan.  Exam via Photo's provided by pt edematous pink papules and plaques +/- dermatographism  Assessment & Plan      Idiopathic urticaria  Related Procedures P-ANCA Titer C-ANCA Titer ANA W/Rfx to all if Positive   1. Chronic Urticaria - Assessment: Patient reports a history of chronic hives for approximately three to four years, with symptoms worsening in recent months. Identified triggers include cold weather, heat, tight clothing, pressure, and stress. Previous use of antihistamines has been ineffective in preventing outbreaks. - Plan:   a. Prescribe Singulair (montelukast) for daily use.   b. Advise the patient to continue taking Zyrtec (cetirizine) or Allegra (fexofenadine) each morning.   c. Consider switching to Xyzal (levocetirizine) if Zyrtec and Allegra prove ineffective.   d. Schedule a follow-up appointment in six weeks to  evaluate the pattern and frequency of hives and to review lab work.   Chronic and persistent condition with duration or expected duration over one year. Condition is symptomatic/ bothersome to patient. Not currently at goal.   Patient Education Urticaria or hives is a pink to red patchy whelp- like rash of the skin that typically itches and it is the result of histamine release in the skin.   Hives may have multiple causes including stress, medications, infections, and systemic illness.  Sometimes there is a family history of chronic urticaria.   "Physical urticarias" may be caused by pressure (dermatographism), heat, sun, cold, vibration.  Insect bites can cause "papular urticaria". It is often difficult to find the cause of generalized hives.  Statistically, 70% of the time a cause of generalized hives is not found.  Sometimes hives can spontaneously resolve. Other times hives can persist and when it does, and no cause is found, and it has been at least 6 weeks since started, it is called "chronic idiopathic urticaria". Antihistamines are the mainstay for treatment.  In severe cases Xolair injections may be used.   2. Post-inflammatory Hyperpigmentation - Assessment: Patient experiences dark spots and bruising following resolution of hives. - Plan:   a. Prescribe Triamcinolone cream for twice-daily application on active hives to mitigate inflammation and reduce the likelihood of post-inflammatory pigment changes.  3. Vasculitis (Ruled Out) - Assessment: Clinical presentation does not align with vasculitis, and concerns raised by the allergist regarding vasculitis are not supported by clinical evidence. - Plan:   a. Order lab tests  for P-ANCA and C-ANCA markers to definitively rule out vasculitis.   b. Review existing lab work from the primary care physician and allergist to prevent unnecessary test duplication.  Return in about 6 weeks (around 06/06/2023), or fu hives.  Owens Shark,  CMA, am acting as scribe for [employer], DO.   Documentation: I have reviewed the above documentation for accuracy and completeness, and I agree with the above.  Langston Reusing, DO

## 2023-04-26 LAB — ANA W/RFX TO ALL IF POSITIVE: Anti Nuclear Antibody (ANA): NEGATIVE

## 2023-06-06 ENCOUNTER — Ambulatory Visit: Payer: BC Managed Care – PPO | Admitting: Dermatology

## 2023-07-25 ENCOUNTER — Ambulatory Visit: Payer: BC Managed Care – PPO | Admitting: Dermatology

## 2023-07-25 ENCOUNTER — Encounter: Payer: Self-pay | Admitting: Dermatology

## 2023-07-25 VITALS — BP 95/57

## 2023-07-25 DIAGNOSIS — L501 Idiopathic urticaria: Secondary | ICD-10-CM

## 2023-07-25 MED ORDER — TRIAMCINOLONE ACETONIDE 0.1 % EX CREA: TOPICAL_CREAM | CUTANEOUS | 2 refills | Status: AC

## 2023-07-25 NOTE — Progress Notes (Signed)
   Follow-Up Visit   Subjective  Haley Watson is a 47 y.o. female who presents for the following: Chronic urticaria follow up - still getting hives if she gets cold or if she is stressed or tired. She is taking Singulair daily. She saw an allergist 04/24/2023 and they referred her to [place]. She has slowed her life down dramatically and that has helped to control the hives.    The following portions of the chart were reviewed this encounter and updated as appropriate: medications, allergies, medical history  Review of Systems:  No other skin or systemic complaints except as noted in HPI or Assessment and Plan.  Objective  Well appearing patient in no apparent distress; mood and affect are within normal limits.   A focused examination was performed of the following areas:   Relevant exam findings are noted in the Assessment and Plan.    Assessment & Plan   Chronic Urticaria (Hives) - Assessment: Patient reports ongoing hives with noted improvement since the last visit 3 months ago. Negative ANA test for autoimmune lupus. Stress and fatigue identified as triggers. Current use of Singulair has shown some improvement. Menopausal changes at age 1 may be contributing to vasomotor instability, potentially presenting as hives.  - Plan:   - Continue Singulair.   - Prescribe triamcinolone 0.1% cream for symptomatic relief, to be applied twice daily for up to two weeks.   - Consider follow up with allergist for potential Xolair treatment.   - Recommend scheduling appointment with a gynecologist for a full hormonal workup since pt is also noticing association with perimenopause symptoms of hot flashes. .   - Follow up in 6 months.   Return in about 6 months (around 01/23/2024) for Follow up.  I, Joanie Coddington, CMA, am acting as scribe for [employer], DO .   Documentation: I have reviewed the above documentation for accuracy and completeness, and I agree with the above.  Langston Reusing, DO

## 2023-07-25 NOTE — Patient Instructions (Addendum)
Hello Haley Watson,  Thank you for visiting [place] today. We appreciate your commitment to improving your health and managing your condition. Here is a summary of the key instructions from today's consultation:  - Singulair: Continue taking Singulair as prescribed.  - Stress Management: Continue reducing stress as it may trigger your hives. As you mentioned, techniques such as breathing exercises can be beneficial so continue these as needed.  - Hormonal Changes: We discussed the possibility of exploring hormonal changes as a contributing factor to your symptoms. Consulting with a gynecologist for a detailed evaluation and discussion about hormone replacement therapy might be helpful.  - Xolair: Another option is to consider Xolair, an injectable medication for chronic urticaria, if other treatments are ineffective. This should be discussed with your allergist.  - Prescription Cream: I have prescribed Triamcinolone 0.1% cream. Please apply it twice a day for up to two weeks to relieve itchiness from hives  - Follow-Up: We will touch base in 6 months to assess your progress.  We look forward to seeing the positive changes in your next visit. If you have any questions or concerns before then, please do not hesitate to contact our office.  Warm regards,  Dr. Langston Reusing, Dermatology Important Information  Due to recent changes in healthcare laws, you may see results of your pathology and/or laboratory studies on MyChart before the doctors have had a chance to review them. We understand that in some cases there may be results that are confusing or concerning to you. Please understand that not all results are received at the same time and often the doctors may need to interpret multiple results in order to provide you with the best plan of care or course of treatment. Therefore, we ask that you please give [place] 2 business days to thoroughly review all your results before contacting the office for  clarification. Should we see a critical lab result, you will be contacted sooner.   If You Need Anything After Your Visit  If you have any questions or concerns for your doctor, please call our main line at [phone] If no one answers, please leave a voicemail as directed and we will return your call as soon as possible. Messages left after 4 pm will be answered the following business day.   You may also send [place] a message via MyChart. We typically respond to MyChart messages within 1-2 business days.  For prescription refills, please ask your pharmacy to contact our office. Our fax number is [phone].  If you have an urgent issue when the clinic is closed that cannot wait until the next business day, you can page your doctor at the number below.    Please note that while we do our best to be available for urgent issues outside of office hours, we are not available 24/7.   If you have an urgent issue and are unable to reach [place], you may choose to seek medical care at your doctor's office, retail clinic, urgent care center, or emergency room.  If you have a medical emergency, please immediately call 911 or go to the emergency department. In the event of inclement weather, please call our main line at [phone] for an update on the status of any delays or closures.  Dermatology Medication Tips: Please keep the boxes that topical medications come in in order to help keep track of the instructions about where and how to use these. Pharmacies typically print the medication instructions only on the boxes and not directly  on the medication tubes.   If your medication is too expensive, please contact our office at [phone] or send [place] a message through MyChart.   We are unable to tell what your co-pay for medications will be in advance as this is different depending on your insurance coverage. However, we may be able to find a substitute medication at lower cost or fill out paperwork to  get insurance to cover a needed medication.   If a prior authorization is required to get your medication covered by your insurance company, please allow [place] 1-2 business days to complete this process.  Drug prices often vary depending on where the prescription is filled and some pharmacies may offer cheaper prices.  The website www.goodrx.com contains coupons for medications through different pharmacies. The prices here do not account for what the cost may be with help from insurance (it may be cheaper with your insurance), but the website can give you the price if you did not use any insurance.  - You can print the associated coupon and take it with your prescription to the pharmacy.  - You may also stop by our office during regular business hours and pick up a GoodRx coupon card.  - If you need your prescription sent electronically to a different pharmacy, notify our office through [facility] or by phone at [phone]

## 2023-08-15 ENCOUNTER — Ambulatory Visit: Payer: Self-pay | Admitting: Dermatology

## 2023-08-28 ENCOUNTER — Encounter: Payer: Self-pay | Admitting: Podiatry

## 2023-08-28 ENCOUNTER — Ambulatory Visit: Payer: 59 | Admitting: Podiatry

## 2023-08-28 DIAGNOSIS — L600 Ingrowing nail: Secondary | ICD-10-CM | POA: Diagnosis not present

## 2023-08-28 DIAGNOSIS — B351 Tinea unguium: Secondary | ICD-10-CM | POA: Diagnosis not present

## 2023-08-28 NOTE — Patient Instructions (Signed)

## 2023-08-28 NOTE — Progress Notes (Signed)
 Subjective:  Patient ID: Haley Watson, female    DOB: February 13, 1976,   MRN: [id number]  Chief Complaint  Patient presents with   Toe Pain    Pt presents for pain in the great toe on the left states,that her toe nails are discolored can't put pressure on the toe at all.    48 y.o. female presents for concern of left great toenail pain as well as concern for possible fungus. 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 Relates the last 6 months or so the left great toe has been very painful and has been limited in shoes and what she can do. Denies any treatments  . Denies any other pedal complaints. Denies n/v/f/c.   Past Medical History:  Diagnosis Date   Insomnia    Situational depression    Tobacco abuse    cigarette smoker   Urticaria    Vitamin D deficiency     Objective:  Physical Exam: Vascular: DP/PT pulses 2/4 bilateral. CFT <3 seconds. Normal hair growth on digits. No edema.  Skin. No lacerations or abrasions bilateral feet. Left hallux nail incurvated and pincer type nail with tenderness to palpation. No erythema edema or purulence noted. Lesser digits 3-5 bilateral thickened and dystrophic with subungual debris.  Musculoskeletal: MMT 5/5 bilateral lower extremities in DF, PF, Inversion and Eversion. Deceased ROM in DF of ankle joint.  Neurological: Sensation intact to light touch.   Assessment:   1. Ingrown left greater toenail   2. Onychomycosis      Plan:  Patient was evaluated and treated and all questions answered. Discussed ingrown toenails etiology and treatment options including procedure for removal vs conservative care.  Patient requesting removal of ingrown nail today. Procedure below.  Discussed procedure and post procedure care and patient expressed understanding.   -Examined patient -Discussed treatment options for painful dystrophic nails  -Clinical picture and Fungal culture was obtained by removing a portion of the hard nail itself from each of the  involved toenails using a sterile nail nipper and sent to [facility] [place] lab. Patient tolerated the biopsy procedure well without discomfort or need for anesthesia.  -Discussed fungal nail treatment options including oral, topical, and laser treatments.  -Patient to return in 4 weeks for follow up evaluation and discussion of fungal culture results or sooner if symptoms worsen.     Procedure:  Procedure: total Nail Avulsion of left hallux nail Surgeon: Asberry Failing, DPM  Pre-op Dx: Ingrown toenail without infection Post-op: Same  Place of Surgery: Office exam room.  Indications for surgery: Painful and ingrown toenail.    The patient is requesting removal of nail without  chemical matrixectomy. Risks and complications were discussed with the patient for which they understand and written consent was obtained. Under sterile conditions a total of 3 mL of  1% lidocaine plain was infiltrated in a hallux block fashion. Once anesthetized, the skin was prepped in sterile fashion. A tourniquet was then applied. Next the entire hallux left nail was removed and area copiously irrigated. Silvadene was applied. A dry sterile dressing was applied. After application of the dressing the tourniquet was removed and there is found to be an immediate capillary refill time to the digit. The patient tolerated the procedure well without any complications. Post procedure instructions were discussed the patient for which he verbally understood. Follow-up in two weeks for nail check or sooner if any problems are to arise. Discussed signs/symptoms of infection and directed to call the office immediately  should any occur or go directly to the emergency room. In the meantime, encouraged to call the office with any questions, concerns, changes symptoms.   Asberry Failing, DPM

## 2023-08-28 NOTE — Addendum Note (Signed)
 Addended by: Daryel November on: 08/28/2023 01:45 PM   Modules accepted: Orders

## 2023-09-01 ENCOUNTER — Other Ambulatory Visit: Payer: Self-pay | Admitting: Podiatry

## 2023-09-27 ENCOUNTER — Encounter: Payer: Self-pay | Admitting: Podiatry

## 2023-09-27 ENCOUNTER — Ambulatory Visit: Payer: 59 | Admitting: Podiatry

## 2023-09-27 DIAGNOSIS — L603 Nail dystrophy: Secondary | ICD-10-CM

## 2023-09-27 DIAGNOSIS — L600 Ingrowing nail: Secondary | ICD-10-CM | POA: Diagnosis not present

## 2023-09-27 NOTE — Progress Notes (Signed)
  Subjective:  Patient ID: Haley Watson, female    DOB: [birth year],   MRN: [id number]  No chief complaint on file.   48 y.o. female presents for follow-up of left hallux nail avulsion and fungal culture results.  . Denies any other pedal complaints. Denies n/v/f/c.   Past Medical History:  Diagnosis Date   Insomnia    Situational depression    Tobacco abuse    cigarette smoker   Urticaria    Vitamin D deficiency     Objective:  Physical Exam: Vascular: DP/PT pulses 2/4 bilateral. CFT <3 seconds. Normal hair growth on digits. No edema.  Skin. No lacerations or abrasions bilateral feet. Left hallux nail bed well healed.  Lesser digits 3-5 bilateral thickened and dystrophic with subungual debris.  Musculoskeletal: MMT 5/5 bilateral lower extremities in DF, PF, Inversion and Eversion. Deceased ROM in DF of ankle joint.  Neurological: Sensation intact to light touch.   Assessment:   1. Ingrown left greater toenail   2. Onychodystrophy       Plan:  Patient was evaluated and treated and all questions answered. Toe was evaluated and appears to be healing well.  May discontinue soaks and neosporin.  Patient to follow-up as needed.    -Examined patient -Discussed treatment options for painful dystrophic nails  -Culutre negative for any fungus -Discussed [place] of vicks or urea nail gel as nail grows back.   -Patient to return as needed     Louann Sjogren, DPM

## 2023-10-16 ENCOUNTER — Ambulatory Visit: Payer: BC Managed Care – PPO | Admitting: Internal Medicine

## 2023-10-16 DIAGNOSIS — J309 Allergic rhinitis, unspecified: Secondary | ICD-10-CM

## 2023-11-19 ENCOUNTER — Other Ambulatory Visit: Payer: Self-pay | Admitting: Dermatology

## 2023-11-27 NOTE — Progress Notes (Signed)
 [facility] [phone]   [phone]  INITIAL CONSULT NOTE  Patient Care Team: Kahoano, Haku K, MD as PCP - General (General Practice)  Hematological/Oncological History 08/17/2023: 3.8 (L), Hgb 13.1, MCV 89, Plt 344, Ferritin 10,  11/01/2023: WBC 4.1, Hgb 13.3, MCV Ferritin 15.60 (L), Iron  56 (L), TIBC 485 (H) 11/27/2023: Establish care with [facility]  CHIEF COMPLAINTS/PURPOSE OF CONSULTATION:  Iron  deficiency  HISTORY OF PRESENTING ILLNESS:  Haley Watson 48 y.o. female presents for an evaluation of iron  deficiency. She is accompanied by her husband for this visit.   On exam today, Ms. Simao reports having ongoing fatigue that does impact her ADLs. 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 She has a good appetite but follows a vegetarian diet. She takes PO iron  therapy consistently for the last three months. She reports dizziness has improved over the last few weeks after starting PO iron  therapy. She denies any signs of overt bleeding including vaginal bleeding, hematochezia or melena. She denies fevers, chills, sweats, shortness of breath, chest pain or cough.  She has no other complaints. Rest of the 10 point ROS is below.   MEDICAL HISTORY:  Past Medical History:  Diagnosis Date   Insomnia    Situational depression    Tobacco abuse    cigarette smoker   Urticaria    Vitamin D deficiency     SURGICAL HISTORY: Past Surgical History:  Procedure Laterality Date   DENTAL SURGERY     KNEE ARTHROSCOPY      SOCIAL HISTORY: Social History   Socioeconomic History   Marital status: Married    Spouse name: Dexter   Number of children: 2   Years of education: college   Highest education level: Not on file  Occupational History   Occupation: dance IT trainer    Comment: [employer]   Tobacco Use   Smoking status: Former    Current packs/day: 1.00    Average packs/day: 1 pack/day for  21.0 years (21.0 ttl pk-yrs)    Types: Cigarettes   Smokeless tobacco: Never   Tobacco comments:    Quit smoking October 2024.   Vaping Use   Vaping status: Never Used  Substance and Sexual Activity   Alcohol use: Yes    Alcohol/week: 0.0 standard drinks of alcohol    Comment: 2x a year   Drug use: No   Sexual activity: Not on file  Other Topics Concern   Not on file  Social History Narrative   Drinks 5 caffeine drinks a day    Social Drivers of Health   Financial Resource Strain: Low Risk  (10/02/2023)   Received from [facility]   Overall Financial Resource Strain (CARDIA)    Difficulty of Paying Living Expenses: Not hard at all  Food Insecurity: No Food Insecurity (10/02/2023)   Received from [facility]   Hunger Vital Sign    Worried About Running Out of Food in the Last Year: Never true    Ran Out of Food in the Last Year: Never true  Transportation Needs: No Transportation Needs (10/02/2023)   Received from [facility] - Transportation    Lack of Transportation (Medical): No    Lack of Transportation (Non-Medical): No  Physical Activity: Sufficiently Active (10/02/2023)   Received from [facility]   Exercise Vital Sign    Days of Exercise per Week: 7 days    Minutes of Exercise per Session: 150+ min  Stress: No Stress Concern Present (10/02/2023)   Received from [facility] - Occupational Stress Questionnaire    Feeling of Stress : Not at all  Social Connections: Socially Integrated (10/02/2023)   Received from [facility]   Social Network    How would you rate your social network (family, work, friends)?: Good participation with social networks  Intimate Partner Violence: Not At Risk (10/02/2023)   Received from [facility]   HITS    Over the last 12 months how often did your partner physically hurt you?: Never    Over the last 12 months how often did your partner insult you or talk down to you?:  Never    Over the last 12 months how often did your partner threaten you with physical harm?: Never    Over the last 12 months how often did your partner scream or curse at you?: Never    FAMILY HISTORY: Family History  Problem Relation Age of Onset   Other Mother        Brain tumor   Cancer Mother 5       brain cancer   Coronary artery disease Father    Sleep apnea Father    Cancer Brother 95       ?    ALLERGIES:  has no known allergies.  MEDICATIONS:  Current Outpatient Medications  Medication Sig Dispense Refill   Biotin 5000 MCG CAPS Take 5 capsules by mouth.     Cholecalciferol 10 MCG (400 UNIT) CAPS Take 50 Units by mouth daily.     ferrous gluconate (FERGON) 240 (27 FE) MG tablet Take 65 mg by mouth.     montelukast  (SINGULAIR ) 10 MG tablet Take 1 tablet by mouth once daily 90 tablet 0   PARoxetine (PAXIL) 10 MG tablet Take 10 mg by mouth daily.     progesterone (PROMETRIUM) 100 MG capsule Take 100 mg by mouth at bedtime.     triamcinolone  cream (KENALOG ) 0.1 % Apply to affected area twice a day x 2 weeks as needed for hives 80 g 2   valACYclovir (VALTREX) 500 MG tablet Take 500 mg by mouth daily.     Vitamin D, Ergocalciferol, (DRISDOL) 50000 units CAPS capsule Take 50,000 Units by mouth every 7 (seven) days.     diphenhydrAMINE (BENADRYL) 12.5 MG chewable tablet Chew 12.5 mg by mouth 4 (four) times daily as needed. (Patient not taking: Reported on 11/28/2023)     vitamin B-12 (CYANOCOBALAMIN ) 1000 MCG tablet Take 1,000 mcg by mouth daily. (Patient not taking: Reported on 11/28/2023)     No current facility-administered medications for this visit.    REVIEW OF SYSTEMS:   Constitutional: ( - ) fevers, ( - )  chills , ( - ) night sweats Eyes: ( - ) blurriness of vision, ( - ) double vision, ( - ) watery eyes Ears, nose, mouth, throat, and face: ( - ) mucositis, ( - ) sore throat Respiratory: ( - ) cough, ( - ) dyspnea, ( - ) wheezes Cardiovascular: ( - ) palpitation,  ( - ) chest discomfort, ( - ) lower extremity swelling Gastrointestinal:  ( - ) nausea, ( - ) heartburn, ( - ) change in bowel habits Skin: ( - ) abnormal skin rashes Lymphatics: ( - ) new lymphadenopathy, ( - ) easy bruising Neurological: ( - ) numbness, ( - ) tingling, ( - ) new weaknesses Behavioral/Psych: ( - ) mood change, ( - )  new changes  All other systems were reviewed with the patient and are negative.  PHYSICAL EXAMINATION: ECOG PERFORMANCE STATUS: 1 - Symptomatic but completely ambulatory  Vitals:   11/28/23 0939  BP: 113/77  Pulse: 63  Resp: 16  Temp: 97.9 F (36.6 C)  SpO2: 97%   Filed Weights   11/28/23 0939  Weight: 119 lb 4.8 oz (54.1 kg)    GENERAL: well appearing female in NAD  SKIN: skin color, texture, turgor are normal, no rashes or significant lesions EYES: conjunctiva are pink and non-injected, sclera clear LUNGS: clear to auscultation and percussion with normal breathing effort HEART: regular rate & rhythm and no murmurs and no lower extremity edema Musculoskeletal: no cyanosis of digits and no clubbing  PSYCH: alert & oriented x 3, fluent speech NEURO: no focal motor/sensory deficits  LABORATORY DATA:  I have reviewed the data as listed    Latest Ref Rng & Units 11/28/2023   10:49 AM 06/27/2016    3:12 PM  CBC  WBC 4.0 - 10.5 K/uL 5.0  6.3   Hemoglobin 12.0 - 15.0 g/dL 16.1  09.6   Hematocrit 36.0 - 46.0 % 40.4  38.9   Platelets 150 - 400 K/uL 312  300        Latest Ref Rng & Units 11/28/2023   10:49 AM 06/27/2016    3:12 PM  CMP  Glucose 70 - 99 mg/dL 94  91   BUN 6 - 20 mg/dL 13  7.0   Creatinine 0.45 - 1.00 mg/dL 4.09  0.8   Sodium 811 - 145 mmol/L 139  141   Potassium 3.5 - 5.1 mmol/L 4.3  4.4   Chloride 98 - 111 mmol/L 103    CO2 22 - 32 mmol/L 31  25   Calcium 8.9 - 10.3 mg/dL 91.4  78.2   Total Protein 6.5 - 8.1 g/dL 7.8  7.6   Total Bilirubin 0.0 - 1.2 mg/dL 0.5  9.56   Alkaline Phos 38 - 126 U/L 53  46   AST 15 - 41 U/L  28  22   ALT 0 - 44 U/L 23  15     ASSESSMENT & PLAN Haley Watson is a 48 y.o. female who presents to the clinic for evaluation of iron  deficiency.   # Iron  Deficiency 2/2 to inadequate dietary intake.  -- Findings are consistent with iron  deficiency anemia secondary to inadequate dietary intake (vegetarian diet) --We will confirm iron  deficiency by ordering iron  panel and ferritin as well as reticulocytes, CBC, and CMP --Continue ferrous sulfate 325 mg once daily with a source of vitamin C --Provided lists of iron  rich foods she can incorporate into her diet.  --Will consider IV iron  infusion if there is evidence of iron  deficiency.  --RTC in 3 months with labs only and 6 months with labs/follow up.  #Vegetarian diet: --Will check B12 and MMA levels today   Orders Placed This Encounter  Procedures   CBC with Differential (Cancer Center Only)    Standing Status:   Future    Number of Occurrences:   1    Expiration Date:   11/27/2024   CMP (Cancer Center only)    Standing Status:   Future    Number of Occurrences:   1    Expiration Date:   11/27/2024   Iron  and Iron  Binding Capacity ([facility] only)    Standing Status:   Future    Number of Occurrences:   1  Expiration Date:   11/27/2024   Ferritin    Standing Status:   Future    Number of Occurrences:   1    Expiration Date:   11/27/2024   Vitamin B12    Standing Status:   Future    Number of Occurrences:   1    Expiration Date:   11/27/2024   Methylmalonic acid, serum    Standing Status:   Future    Number of Occurrences:   1    Expiration Date:   11/27/2024    All questions were answered. The patient knows to call the clinic with any problems, questions or concerns.  I have spent a total of 60 minutes minutes of face-to-face and non-face-to-face time, preparing to see the patient, obtaining and/or reviewing separately obtained history, performing a medically appropriate examination, counseling and educating the  patient, ordering medications/tests/procedures, documenting clinical information in the electronic health record, independently interpreting results and communicating results to the patient, and care coordination.   Wyline Hearing, PA-C Department of Hematology/Oncology [facility] at [facility] Phone: [phone]  Patient was seen with Dr. Rosaline Coma.  I have read the above note and personally examined the patient. I agree with the assessment and plan as noted above.  Briefly Ms. Ahmani Daoud is a 48 year old female who presents for evaluation of iron  deficiency without anemia.  She has been taking p.o. iron  therapy but remains iron  deficient.  She is vegetarian and has no overt signs of bleeding such as nosebleeds, gum bleeding, or dark stools.  At this time recommend pursuing IV iron  therapy in order to help bolster her iron  levels.  Additionally if her levels continue to fall despite adequate IV repletion recommend consideration of endoscopic evaluation.  The patient voiced understanding of our findings and plan moving forward.  Will plan to see her back approximately 4 to 6 weeks after IV iron  in order to assure her levels have increased appropriately.   Rogerio Clay, MD Department of Hematology/Oncology [facility] at [facility] Phone: [phone] Pager: [phone] Email: [email or web address]

## 2023-11-28 ENCOUNTER — Inpatient Hospital Stay: Payer: Self-pay | Attending: Physician Assistant | Admitting: Physician Assistant

## 2023-11-28 ENCOUNTER — Encounter: Payer: Self-pay | Admitting: Physician Assistant

## 2023-11-28 ENCOUNTER — Inpatient Hospital Stay: Payer: Self-pay

## 2023-11-28 VITALS — BP 113/77 | HR 63 | Temp 97.9°F | Resp 16 | Wt 119.3 lb

## 2023-11-28 DIAGNOSIS — D509 Iron deficiency anemia, unspecified: Secondary | ICD-10-CM

## 2023-11-28 DIAGNOSIS — E638 Other specified nutritional deficiencies: Secondary | ICD-10-CM | POA: Diagnosis not present

## 2023-11-28 DIAGNOSIS — E611 Iron deficiency: Secondary | ICD-10-CM

## 2023-11-28 LAB — CMP (CANCER CENTER ONLY)
ALT: 23 U/L (ref 0–44)
AST: 28 U/L (ref 15–41)
Albumin: 5.4 g/dL — ABNORMAL HIGH (ref 3.5–5.0)
Alkaline Phosphatase: 53 U/L (ref 38–126)
Anion gap: 5 (ref 5–15)
BUN: 13 mg/dL (ref 6–20)
CO2: 31 mmol/L (ref 22–32)
Calcium: 10.1 mg/dL (ref 8.9–10.3)
Chloride: 103 mmol/L (ref 98–111)
Creatinine: 0.75 mg/dL (ref 0.44–1.00)
GFR, Estimated: >60 mL/min (ref >60)
Glucose, Bld: 94 mg/dL (ref 70–99)
Potassium: 4.3 mmol/L (ref 3.5–5.1)
Sodium: 139 mmol/L (ref 135–145)
Total Bilirubin: 0.5 mg/dL (ref 0.0–1.2)
Total Protein: 7.8 g/dL (ref 6.5–8.1)

## 2023-11-28 LAB — CBC WITH DIFFERENTIAL (CANCER CENTER ONLY)
Abs Immature Granulocytes: 0 10*3/uL (ref 0.00–0.07)
Basophils Absolute: 0 10*3/uL (ref 0.0–0.1)
Basophils Relative: 1 %
Eosinophils Absolute: 0.1 10*3/uL (ref 0.0–0.5)
Eosinophils Relative: 2 %
HCT: 40.4 % (ref 36.0–46.0)
Hemoglobin: 14.1 g/dL (ref 12.0–15.0)
Immature Granulocytes: 0 %
Lymphocytes Relative: 51 %
Lymphs Abs: 2.6 10*3/uL (ref 0.7–4.0)
MCH: 30.8 pg (ref 26.0–34.0)
MCHC: 34.9 g/dL (ref 30.0–36.0)
MCV: 88.2 fL (ref 80.0–100.0)
Monocytes Absolute: 0.4 10*3/uL (ref 0.1–1.0)
Monocytes Relative: 8 %
Neutro Abs: 1.9 10*3/uL (ref 1.7–7.7)
Neutrophils Relative %: 38 %
Platelet Count: 312 10*3/uL (ref 150–400)
RBC: 4.58 MIL/uL (ref 3.87–5.11)
RDW: 12.4 % (ref 11.5–15.5)
WBC Count: 5 10*3/uL (ref 4.0–10.5)
nRBC: 0 % (ref 0.0–0.2)

## 2023-11-28 LAB — VITAMIN B12: Vitamin B-12: 322 pg/mL (ref 180–914)

## 2023-11-28 LAB — FERRITIN: Ferritin: 21 ng/mL (ref 11–307)

## 2023-11-28 LAB — IRON AND IRON BINDING CAPACITY (CC-WL,HP ONLY)
Iron: 108 ug/dL (ref 28–170)
Saturation Ratios: 23 % (ref 10.4–31.8)
TIBC: 462 ug/dL — ABNORMAL HIGH (ref 250–450)
UIBC: 354 ug/dL (ref 148–442)

## 2023-11-30 ENCOUNTER — Encounter: Payer: Self-pay | Admitting: Physician Assistant

## 2023-11-30 ENCOUNTER — Other Ambulatory Visit: Payer: Self-pay | Admitting: Physician Assistant

## 2023-11-30 DIAGNOSIS — E611 Iron deficiency: Secondary | ICD-10-CM | POA: Insufficient documentation

## 2023-11-30 LAB — METHYLMALONIC ACID, SERUM: Methylmalonic Acid, Quantitative: 162 nmol/L (ref 0–378)

## 2023-12-04 ENCOUNTER — Telehealth: Payer: Self-pay | Admitting: Pharmacy Technician

## 2023-12-04 ENCOUNTER — Encounter: Payer: Self-pay | Admitting: Physician Assistant

## 2023-12-04 ENCOUNTER — Other Ambulatory Visit: Payer: Self-pay | Admitting: Physician Assistant

## 2023-12-04 NOTE — Telephone Encounter (Addendum)
 Pastor Bong is non preferred and will be denied if patient has not failed preferred medication.  Preferred medication is Venofer .  Would you like to use Venofer ?  Auth Submission: NO AUTH NEEDED Site of care: Site of care: [facility] Payer: [employer] Medication & CPT/J Code(s) submitted: Venofer  (Iron  Sucrose) J1756 Route of submission (phone, fax, portal):  Phone # Fax # Auth type: Buy/Bill PB Units/visits requested: 200MG  X3 DOSES Reference number:  Approval from: 12/04/23 - 04/04/24

## 2023-12-07 ENCOUNTER — Ambulatory Visit

## 2023-12-07 VITALS — BP 101/65 | HR 59 | Temp 98.1°F | Resp 16 | Ht 67.0 in | Wt 118.8 lb

## 2023-12-07 DIAGNOSIS — E611 Iron deficiency: Secondary | ICD-10-CM | POA: Diagnosis not present

## 2023-12-07 MED ORDER — IRON SUCROSE 20 MG/ML IV SOLN
200.0000 mg | Freq: Once | INTRAVENOUS | Status: AC
  Administered 2023-12-07: 200 mg via INTRAVENOUS
  Filled 2023-12-07: qty 10

## 2023-12-07 NOTE — Patient Instructions (Signed)

## 2023-12-07 NOTE — Progress Notes (Signed)
 Diagnosis: Iron Deficiency Anemia  Provider:  Chilton Greathouse MD  Procedure: IV Push  IV Type: Peripheral, IV Location: R Antecubital  Venofer (Iron Sucrose), Dose: 200 mg  Post Infusion IV Care: Observation period completed and Peripheral IV Discontinued  Discharge: Condition: Good, Destination: Home . AVS Provided  Performed by:  Rico Ala, LPN

## 2023-12-13 ENCOUNTER — Encounter: Payer: Self-pay | Admitting: Physician Assistant

## 2023-12-14 ENCOUNTER — Ambulatory Visit (INDEPENDENT_AMBULATORY_CARE_PROVIDER_SITE_OTHER): Admitting: *Deleted

## 2023-12-14 VITALS — BP 108/66 | HR 67 | Temp 97.5°F | Resp 16 | Ht 67.0 in | Wt 118.8 lb

## 2023-12-14 DIAGNOSIS — E611 Iron deficiency: Secondary | ICD-10-CM

## 2023-12-14 MED ORDER — IRON SUCROSE 20 MG/ML IV SOLN
200.0000 mg | Freq: Once | INTRAVENOUS | Status: AC
  Administered 2023-12-14: 200 mg via INTRAVENOUS
  Filled 2023-12-14: qty 10

## 2023-12-14 NOTE — Progress Notes (Signed)
 Diagnosis: Iron Deficiency Anemia  Provider:  Chilton Greathouse MD  Procedure: IV Push  IV Type: Peripheral, IV Location: R Antecubital  Venofer (Iron Sucrose), Dose: 200 mg  Post Infusion IV Care: Observation period completed and Peripheral IV Discontinued  Discharge: Condition: Good, Destination: Home . AVS Declined  Performed by:  Forrest Moron, RN

## 2023-12-21 ENCOUNTER — Ambulatory Visit

## 2023-12-21 VITALS — BP 92/59 | HR 58 | Temp 97.2°F | Resp 16 | Ht 67.0 in | Wt 119.4 lb

## 2023-12-21 DIAGNOSIS — E611 Iron deficiency: Secondary | ICD-10-CM

## 2023-12-21 MED ORDER — IRON SUCROSE 20 MG/ML IV SOLN
200.0000 mg | Freq: Once | INTRAVENOUS | Status: AC
  Administered 2023-12-21: 200 mg via INTRAVENOUS
  Filled 2023-12-21: qty 10

## 2023-12-21 NOTE — Progress Notes (Signed)
 Diagnosis: Iron  Deficiency Anemia  Provider:  Praveen Mannam MD  Procedure: IV Push  IV Type: Peripheral, IV Location: R Antecubital  Venofer  (Iron  Sucrose), Dose: 200 mg  Post Infusion IV Care:  Patient requested 5 min obs period  Discharge: Condition: Good, Destination: Home . AVS Declined  Performed by:  Lauran Pollard, LPN

## 2024-01-23 ENCOUNTER — Ambulatory Visit: Payer: BC Managed Care – PPO | Admitting: Dermatology

## 2024-02-15 ENCOUNTER — Other Ambulatory Visit: Payer: Self-pay | Admitting: Dermatology

## 2024-02-22 ENCOUNTER — Other Ambulatory Visit: Payer: Self-pay | Admitting: Physician Assistant

## 2024-02-22 DIAGNOSIS — E611 Iron deficiency: Secondary | ICD-10-CM

## 2024-02-23 ENCOUNTER — Inpatient Hospital Stay: Payer: Self-pay | Attending: Physician Assistant

## 2024-02-23 DIAGNOSIS — D508 Other iron deficiency anemias: Secondary | ICD-10-CM | POA: Diagnosis present

## 2024-02-23 DIAGNOSIS — E611 Iron deficiency: Secondary | ICD-10-CM

## 2024-02-23 LAB — CMP (CANCER CENTER ONLY)
ALT: 21 U/L (ref 0–44)
AST: 23 U/L (ref 15–41)
Albumin: 4.9 g/dL (ref 3.5–5.0)
Alkaline Phosphatase: 57 U/L (ref 38–126)
Anion gap: 6 (ref 5–15)
BUN: 10 mg/dL (ref 6–20)
CO2: 30 mmol/L (ref 22–32)
Calcium: 9.6 mg/dL (ref 8.9–10.3)
Chloride: 103 mmol/L (ref 98–111)
Creatinine: 0.76 mg/dL (ref 0.44–1.00)
GFR, Estimated: >60 mL/min (ref >60)
Glucose, Bld: 88 mg/dL (ref 70–99)
Potassium: 3.8 mmol/L (ref 3.5–5.1)
Sodium: 139 mmol/L (ref 135–145)
Total Bilirubin: 0.6 mg/dL (ref 0.0–1.2)
Total Protein: 7.3 g/dL (ref 6.5–8.1)

## 2024-02-23 LAB — IRON AND IRON BINDING CAPACITY (CC-WL,HP ONLY)
Iron: 160 ug/dL (ref 28–170)
Saturation Ratios: 42 % — ABNORMAL HIGH (ref 10.4–31.8)
TIBC: 384 ug/dL (ref 250–450)
UIBC: 224 ug/dL (ref 148–442)

## 2024-02-23 LAB — CBC WITH DIFFERENTIAL (CANCER CENTER ONLY)
Abs Immature Granulocytes: 0 K/uL (ref 0.00–0.07)
Basophils Absolute: 0 K/uL (ref 0.0–0.1)
Basophils Relative: 1 %
Eosinophils Absolute: 0.1 K/uL (ref 0.0–0.5)
Eosinophils Relative: 1 %
HCT: 37.1 % (ref 36.0–46.0)
Hemoglobin: 13.1 g/dL (ref 12.0–15.0)
Immature Granulocytes: 0 %
Lymphocytes Relative: 52 %
Lymphs Abs: 2.4 K/uL (ref 0.7–4.0)
MCH: 31.6 pg (ref 26.0–34.0)
MCHC: 35.3 g/dL (ref 30.0–36.0)
MCV: 89.6 fL (ref 80.0–100.0)
Monocytes Absolute: 0.3 K/uL (ref 0.1–1.0)
Monocytes Relative: 7 %
Neutro Abs: 1.8 K/uL (ref 1.7–7.7)
Neutrophils Relative %: 39 %
Platelet Count: 311 K/uL (ref 150–400)
RBC: 4.14 MIL/uL (ref 3.87–5.11)
RDW: 11.9 % (ref 11.5–15.5)
WBC Count: 4.6 K/uL (ref 4.0–10.5)
nRBC: 0 % (ref 0.0–0.2)

## 2024-02-23 LAB — FERRITIN: Ferritin: 129 ng/mL (ref 11–307)

## 2024-02-26 ENCOUNTER — Ambulatory Visit: Payer: Self-pay | Admitting: Physician Assistant

## 2024-02-27 ENCOUNTER — Other Ambulatory Visit

## 2024-04-28 ENCOUNTER — Other Ambulatory Visit: Payer: Self-pay | Admitting: Dermatology

## 2024-05-14 ENCOUNTER — Other Ambulatory Visit: Payer: Self-pay | Admitting: Adult Medicine

## 2024-05-14 DIAGNOSIS — Z1231 Encounter for screening mammogram for malignant neoplasm of breast: Secondary | ICD-10-CM

## 2024-05-16 ENCOUNTER — Other Ambulatory Visit: Payer: Self-pay | Admitting: Dermatology

## 2024-05-23 ENCOUNTER — Ambulatory Visit
Admission: RE | Admit: 2024-05-23 | Discharge: 2024-05-23 | Disposition: A | Discharge status: Home / Self Care | Source: Ambulatory Visit | Attending: Adult Medicine | Admitting: Adult Medicine

## 2024-05-23 DIAGNOSIS — Z1231 Encounter for screening mammogram for malignant neoplasm of breast: Secondary | ICD-10-CM

## 2024-05-26 ENCOUNTER — Other Ambulatory Visit: Payer: Self-pay | Admitting: Dermatology

## 2024-05-27 ENCOUNTER — Other Ambulatory Visit: Payer: Self-pay | Admitting: Physician Assistant

## 2024-05-27 DIAGNOSIS — E611 Iron deficiency: Secondary | ICD-10-CM

## 2024-05-28 ENCOUNTER — Inpatient Hospital Stay: Admitting: Physician Assistant

## 2024-05-28 ENCOUNTER — Inpatient Hospital Stay: Attending: Physician Assistant
